# Patient Record
Sex: Female | Born: 1975
Health system: Southern US, Community
[De-identification: ages and names within clinical notes are randomized; demographics above are authoritative.]

## PROBLEM LIST (undated history)

## (undated) DIAGNOSIS — G43909 Migraine, unspecified, not intractable, without status migrainosus: Secondary | ICD-10-CM

## (undated) DIAGNOSIS — M549 Dorsalgia, unspecified: Secondary | ICD-10-CM

## (undated) DIAGNOSIS — F32A Depression, unspecified: Secondary | ICD-10-CM

## (undated) DIAGNOSIS — M419 Scoliosis, unspecified: Secondary | ICD-10-CM

## (undated) DIAGNOSIS — Q059 Spina bifida, unspecified: Secondary | ICD-10-CM

## (undated) DIAGNOSIS — F329 Major depressive disorder, single episode, unspecified: Secondary | ICD-10-CM

## (undated) HISTORY — PX: ABDOMINOPLASTY: SUR9

## (undated) HISTORY — DX: Major depressive disorder, single episode, unspecified: F32.9

## (undated) HISTORY — DX: Spina bifida, unspecified: Q05.9

## (undated) HISTORY — DX: Dorsalgia, unspecified: M54.9

## (undated) HISTORY — DX: Migraine, unspecified, not intractable, without status migrainosus: G43.909

## (undated) HISTORY — DX: Scoliosis, unspecified: M41.9

## (undated) HISTORY — DX: Depression, unspecified: F32.A

---

## 2012-02-24 ENCOUNTER — Ambulatory Visit (INDEPENDENT_AMBULATORY_CARE_PROVIDER_SITE_OTHER): Payer: BC Managed Care – PPO | Admitting: Emergency Medicine

## 2012-02-24 VITALS — BP 112/71 | HR 50 | Temp 98.1°F | Resp 18 | Ht 63.0 in | Wt 132.0 lb

## 2012-02-24 DIAGNOSIS — L0201 Cutaneous abscess of face: Secondary | ICD-10-CM

## 2012-02-24 DIAGNOSIS — L03211 Cellulitis of face: Secondary | ICD-10-CM

## 2012-02-24 MED ORDER — SULFAMETHOXAZOLE-TRIMETHOPRIM 800-160 MG PO TABS: 1.0000 | ORAL_TABLET | Freq: Two times a day (BID) | ORAL | Status: AC

## 2012-02-24 NOTE — Progress Notes (Signed)
  Subjective:    Patient ID: Jessica Stanton, female    DOB: Jun 09, 1976, 36 y.o.   MRN: [id number]  Rash This is a new problem. The problem has been gradually worsening since onset. The affected locations include the face. The rash is characterized by itchiness, redness and swelling. She was exposed to nothing. Pertinent negatives include no anorexia, congestion, cough, diarrhea, eye pain, facial edema, fatigue, fever, joint pain, nail changes, rhinorrhea, shortness of breath, sore throat or vomiting. Past treatments include nothing.      Review of Systems  Constitutional: Negative for fever and fatigue.  HENT: Negative for congestion, sore throat and rhinorrhea.   Eyes: Negative for pain.  Respiratory: Negative for cough and shortness of breath.   Cardiovascular: Negative.   Gastrointestinal: Negative.  Negative for vomiting, diarrhea and anorexia.  Musculoskeletal: Negative.  Negative for joint pain.  Skin: Positive for rash. Negative for nail changes.       Objective:   Physical Exam  Constitutional: She is oriented to person, place, and time. She appears well-developed and well-nourished.  HENT:  Head: Normocephalic and atraumatic.  Eyes: Conjunctivae are normal. Pupils are equal, round, and reactive to light. No scleral icterus.  Neck: Normal range of motion. Neck supple.  Cardiovascular: Normal rate.   Pulmonary/Chest: Effort normal.  Abdominal: Soft.  Musculoskeletal: Normal range of motion.  Neurological: She is alert and oriented to person, place, and time.  Skin: Skin is warm and dry. There is erythema (forehead).          Assessment & Plan:  Thinks she was bitten by a spider  Instructed to follow up if no improvement in one week or sooner for new or worsened symptoms.  Apply local moist heat qid

## 2012-03-27 ENCOUNTER — Other Ambulatory Visit: Payer: Self-pay | Admitting: Obstetrics & Gynecology

## 2012-03-27 DIAGNOSIS — R928 Other abnormal and inconclusive findings on diagnostic imaging of breast: Secondary | ICD-10-CM

## 2012-06-12 ENCOUNTER — Ambulatory Visit
Admission: RE | Admit: 2012-06-12 | Discharge: 2012-06-12 | Disposition: A | Discharge status: Home / Self Care | Payer: BC Managed Care – PPO | Source: Ambulatory Visit | Attending: Obstetrics & Gynecology | Admitting: Obstetrics & Gynecology

## 2012-06-12 DIAGNOSIS — R928 Other abnormal and inconclusive findings on diagnostic imaging of breast: Secondary | ICD-10-CM

## 2013-02-01 ENCOUNTER — Encounter: Payer: Self-pay | Admitting: Internal Medicine

## 2013-02-01 ENCOUNTER — Ambulatory Visit (INDEPENDENT_AMBULATORY_CARE_PROVIDER_SITE_OTHER): Payer: BC Managed Care – PPO | Admitting: Internal Medicine

## 2013-02-01 VITALS — BP 128/80 | HR 68 | Temp 97.9°F | Ht 62.0 in | Wt 131.0 lb

## 2013-02-01 DIAGNOSIS — W57XXXA Bitten or stung by nonvenomous insect and other nonvenomous arthropods, initial encounter: Secondary | ICD-10-CM

## 2013-02-01 DIAGNOSIS — T3695XA Adverse effect of unspecified systemic antibiotic, initial encounter: Secondary | ICD-10-CM

## 2013-02-01 DIAGNOSIS — B49 Unspecified mycosis: Secondary | ICD-10-CM

## 2013-02-01 DIAGNOSIS — T148 Other injury of unspecified body region: Secondary | ICD-10-CM

## 2013-02-01 MED ORDER — DOXYCYCLINE HYCLATE 100 MG PO TABS: 100.0000 mg | ORAL_TABLET | Freq: Two times a day (BID) | ORAL | Status: DC

## 2013-02-01 MED ORDER — FLUCONAZOLE 150 MG PO TABS: 150.0000 mg | ORAL_TABLET | Freq: Once | ORAL | Status: DC

## 2013-02-01 NOTE — Progress Notes (Signed)
  Subjective:    Patient ID: Jessica Stanton, female    DOB: October 07, 1975, 37 y.o.   MRN: [id number]  HPI  Pt presents to the clinic today with c/o a tick bite. This occurred on her left upper chest. The tick was on there for less than 12 hours. The placed where she pulled it off is still itching. It is not red. She has had some associated fatigue and nausea. She denies fever, chills or body aches. She has no rash. She has never had a tick borne illness before.  Review of Systems      No past medical history on file.  No current outpatient prescriptions on file.   No current facility-administered medications for this visit.    Allergies  Allergen Reactions  . Morphine And Related     No family history on file.  History   Social History  . Marital Status: Single    Spouse Name: N/A    Number of Children: N/A  . Years of Education: N/A   Occupational History  . Not on file.   Social History Main Topics  . Smoking status: Former Games developer  . Smokeless tobacco: Not on file  . Alcohol Use: Not on file  . Drug Use: Not on file  . Sexually Active: Not on file   Other Topics Concern  . Not on file   Social History Narrative  . No narrative on file     Constitutional: Pt reports fatigue. Denies fever, malaise, headache or abrupt weight changes.  Gastrointestinal: Pt reports nausea. Denies abdominal pain, bloating, constipation, diarrhea or blood in the stool.   Skin: Pt reports tick bite. Denies redness, rashes, lesions or ulcercations.  Neurological: Denies dizziness, difficulty with memory, difficulty with speech or problems with balance and coordination.   No other specific complaints in a complete review of systems (except as listed in HPI above).  Objective:   Physical Exam  BP 128/80  Pulse 68  Temp(Src) 97.9 F (36.6 C) (Oral)  Ht 5\' 2"  (1.575 m)  Wt 131 lb (59.421 kg)  BMI 23.95 kg/m2  SpO2 97%  LMP 01/15/2013 Wt Readings from Last 3 Encounters:   02/01/13 131 lb (59.421 kg)  02/24/12 132 lb (59.875 kg)    General: Appears her stated age, well developed, well nourished in NAD. Skin: Warm, dry and intact. Small healed tick bite of left upper chest, no redness or cellulitis noted. Cardiovascular: Normal rate and rhythm. S1,S2 noted.  No murmur, rubs or gallops noted. No JVD or BLE edema. No carotid bruits noted. Pulmonary/Chest: Normal effort and positive vesicular breath sounds. No respiratory distress. No wheezes, rales or ronchi noted.  Abdomen: Soft and nontender. Normal bowel sounds, no bruits noted. No distention or masses noted. Liver, spleen and kidneys non palpable.  Neurological: Alert and oriented. Cranial nerves II-XII intact. Coordination normal. +DTRs bilaterally.        Assessment & Plan:   Tick bite, new onset:  Given symptoms of fatigue and nausea will prophylactically treat with Doxcycline BID x 10 days  RTC as needed or if symptoms worse

## 2013-02-01 NOTE — Patient Instructions (Signed)
Deer Tick Bite Deer ticks are brown arachnids (spider family) that vary in size from as small as the head of a pin to 1/4 inch (1/2 cm) diameter. They thrive in wooded areas. Deer are the preferred host of adult deer ticks. Small rodents are the host of young ticks (nymphs). When a person walks in a field or wooded area, young and adult ticks in the surrounding grass and vegetation can attach themselves to the skin. They can suck blood for hours to days if unnoticed. Ticks are found all over the [place] Some ticks carry a specific bacteria (Borrelia burgdorferi) that causes an infection called Lyme disease. The bacteria is typically passed into a person during the blood sucking process. This happens after the tick has been attached for at least a number of hours. While ticks can be found all over the [place] those carrying the bacteria that causes Lyme disease are most common in [place] and the [place]. Only a small proportion of ticks in these areas carry the Lyme disease bacteria and cause human infections. Ticks usually attach to warm spots on the body, such as the:  Head.  Back.  Neck.  Armpits.  Groin. SYMPTOMS  Most of the time, a deer tick bite will not be felt. You may or may not see the attached tick. You may notice mild irritation or redness around the bite site. If the deer tick passes the Lyme disease bacteria to a person, a round, red rash may be noticed 2 to 3 days after the bite. The rash may be clear in the middle, like a bull's-eye or target. If not treated, other symptoms may develop several days to weeks after the onset of the rash. These symptoms may include:  New rash lesions.  Fatigue and weakness.  General ill feeling and achiness.  Chills.  Headache and neck pain.  Swollen lymph glands.  Sore muscles and joints. 5 to 15% of untreated people with Lyme disease may develop more severe illnesses after several weeks to months. This may include inflammation of the  brain lining (meningitis), nerve palsies, an abnormal heartbeat, or severe muscle and joint pain and inflammation (myositis or arthritis). DIAGNOSIS   Physical exam and medical history.  Viewing the tick if it was saved for confirmation.  Blood tests (to check or confirm the presence of Lyme disease). TREATMENT  Most ticks do not carry disease. If found, an attached tick should be removed using tweezers. Tweezers should be placed under the body of the tick so it is removed by its attachment parts (pincers). If there are signs or symptoms of being sick, or Lyme disease is confirmed, medicines (antibiotics) that kill germs are usually prescribed. In more severe cases, antibiotics may be given through an intravenous (IV) access. HOME CARE INSTRUCTIONS   Always remove ticks with tweezers. Do not use petroleum jelly or other methods to kill or remove the tick. Slide the tweezers under the body and pull out as much as you can. If you are not sure what it is, save it in a jar and show your caregiver.  Once you remove the tick, the skin will heal on its own. Wash your hands and the affected area with water and soap. You may place a bandage on the affected area.  Take medicine as directed. You may be advised to take a full course of antibiotics.  Follow up with your caregiver as recommended. FINDING OUT THE RESULTS OF YOUR TEST Not all test results are available   during your visit. If your test results are not back during the visit, make an appointment with your caregiver to find out the results. Do not assume everything is normal if you have not heard from your caregiver or the medical facility. It is important for you to follow up on all of your test results. PROGNOSIS  If Lyme disease is confirmed, early treatment with antibiotics is very effective. Following preventive guidelines is important since it is possible to get the disease more than once. PREVENTION   Wear long sleeves and long pants in  wooded or grassy areas. Tuck your pants into your socks.  Use an insect repellent while hiking.  Check yourself, your children, and your pets regularly for ticks after playing outside.  Clear piles of leaves or brush from your yard. Ticks might live there. SEEK MEDICAL CARE IF:   You or your child has an oral temperature above 102 F (38.9 C).  You develop a severe headache following the bite.  You feel generally ill.  You notice a rash.  You are having trouble removing the tick.  The bite area has red skin or yellow drainage. SEEK IMMEDIATE MEDICAL CARE IF:   Your face is weak and droopy or you have other neurological symptoms.  You have severe joint pain or weakness. MAKE SURE YOU:   Understand these instructions.  Will watch your condition.  Will get help right away if you are not doing well or get worse. FOR MORE INFORMATION Centers for Disease Control and Prevention: www.cdc.gov American Academy of [employer]icians: www.aafp.org Document Released: 11/24/2009 Document Revised: 11/22/2011 Document Reviewed: 11/24/2009 [employer] Patient Information 2014 [employer].  

## 2013-03-15 ENCOUNTER — Encounter: Payer: Self-pay | Admitting: *Deleted

## 2013-03-21 ENCOUNTER — Ambulatory Visit: Payer: BC Managed Care – PPO | Admitting: Internal Medicine

## 2013-03-21 DIAGNOSIS — Z0289 Encounter for other administrative examinations: Secondary | ICD-10-CM

## 2015-03-26 HISTORY — PX: BREAST ENHANCEMENT SURGERY: SHX7

## 2015-07-15 ENCOUNTER — Encounter: Payer: Self-pay | Admitting: Nurse Practitioner

## 2015-07-15 ENCOUNTER — Ambulatory Visit: Payer: Self-pay

## 2015-07-15 ENCOUNTER — Ambulatory Visit (INDEPENDENT_AMBULATORY_CARE_PROVIDER_SITE_OTHER): Payer: BLUE CROSS/BLUE SHIELD | Admitting: Nurse Practitioner

## 2015-07-15 ENCOUNTER — Encounter: Payer: Self-pay | Admitting: Podiatry

## 2015-07-15 VITALS — BP 118/74 | HR 80 | Temp 98.4°F | Resp 12 | Ht 63.0 in | Wt 141.0 lb

## 2015-07-15 DIAGNOSIS — R51 Headache: Secondary | ICD-10-CM

## 2015-07-15 DIAGNOSIS — R519 Headache, unspecified: Secondary | ICD-10-CM

## 2015-07-15 DIAGNOSIS — Z9109 Other allergy status, other than to drugs and biological substances: Secondary | ICD-10-CM

## 2015-07-15 DIAGNOSIS — M79673 Pain in unspecified foot: Secondary | ICD-10-CM

## 2015-07-15 DIAGNOSIS — R5382 Chronic fatigue, unspecified: Secondary | ICD-10-CM

## 2015-07-15 DIAGNOSIS — Z91048 Other nonmedicinal substance allergy status: Secondary | ICD-10-CM | POA: Diagnosis not present

## 2015-07-15 NOTE — Patient Instructions (Signed)
Will get head CT and neck xray Will get blood work  To use netti pot daily and as needed for sinuses/ allergies

## 2015-07-15 NOTE — Progress Notes (Signed)
Patient ID: Jessica Stanton, female   DOB: Apr 13, 1976, 39 y.o.   MRN: [id number]    PCP: Webb Silversmith, NP  Advanced Directive information    Allergies  Allergen Reactions  . Morphine And Related     Chief Complaint  Patient presents with  . New Patient (Initial Visit)    Establish care   . Medical Management of Chronic Issues    Migraines, and request for Body Scan      HPI: Patient is a 39 y.o. female seen in the office today to establish care. [facility]. Mostly does acupuncture, chiropractor etc. Hx of headaches for a long time but has gotten worse.  Specialist- GYN, physician for women, Juanda Chance NP, PAP 2016 Dermatologist- [facility]  Over the last 6 months has increase in fatigue and weakness, no motivation. Hands and feet cramp up, more in the last 6 months, numbess and tingling noted to bilateral upper and lower extremities. Has still been able to get up and get things done. Likes to dance.   Significant pain in breast cavity after breast augmentation. Thinking about getting them removed. Has been going for the past year since she has had it.   In the last 3 months she feel against a window and has a headache since. Starts on the side of her head and radiates through the head. Sometimes starts between her side and goes throughout her head.  Stress makes it worse, when she has the headache she needs to be in a dark room without moving. Pounding headaches. For 2 weeks went on every days, now once weekly.  Does take tylenol PM to help the pain which helps.  Over 10 years ago since has had imaging done of neck and back.   Pt reports TSH and hormones checked by GYN and were normal   Review of Systems: Review of Systems  Constitutional: Positive for activity change and fatigue. Negative for fever, appetite change and unexpected weight change.  HENT: Positive for rhinorrhea. Negative for  congestion and postnasal drip.   Cardiovascular: Positive for palpitations (at times, in the last 6 months 4 times).  Genitourinary: Negative for dysuria and difficulty urinating.  Musculoskeletal: Positive for back pain and arthralgias.       Back pain throughout back, from trauma, birth defect, etc  Allergic/Immunologic: Positive for environmental allergies.  Neurological: Positive for numbness (happened over the last 6 months, in feet and hands) and headaches.  Psychiatric/Behavioral: The patient is nervous/anxious.        Has depression, manageable but daily challenge     Past Medical History  Diagnosis Date  . Depression   . Scoliosis   . Spina bifida [facility])    Past Surgical History  Procedure Laterality Date  . Breast enhancement surgery  03/26/2015   Social History:   reports that she has never smoked. She has never used smokeless tobacco. She reports that she does not drink alcohol or use illicit drugs.  Family History  Problem Relation Age of Onset  . Diabetes Other     Other Blood Relative    Medications: Patient's Medications  New Prescriptions   No medications on file  Previous Medications   CYANOCOBALAMIN (B-12 PO)    Take by mouth daily.   MULTIPLE VITAMIN (MULTIVITAMIN) TABLET    Take 1 tablet by mouth daily.  Modified Medications   No medications on file  Discontinued Medications   DOXYCYCLINE (  VIBRA-TABS) 100 MG TABLET    Take 1 tablet (100 mg total) by mouth 2 (two) times daily.   FLUCONAZOLE (DIFLUCAN) 150 MG TABLET    Take 1 tablet (150 mg total) by mouth once.   NON FORMULARY    Molasses, Chloraphil     Physical Exam:  Filed Vitals:   07/15/15 0859  BP: 118/74  Pulse: 80  Temp: 98.4 F (36.9 C)  TempSrc: Oral  Resp: 12  Height: 5\' 3"  (1.6 m)  Weight: 141 lb (63.957 kg)  SpO2: 98%   Body mass index is 24.98 kg/(m^2).  Physical Exam  Constitutional: She is oriented to person, place, and time. She appears well-developed and  well-nourished. No distress.  HENT:  Head: Normocephalic and atraumatic.  Nose: Nose normal.  Mouth/Throat: Oropharynx is clear and moist. No oropharyngeal exudate.  Eyes: Conjunctivae are normal. Pupils are equal, round, and reactive to light.  Neck: Normal range of motion. Neck supple.  Cardiovascular: Normal rate, regular rhythm and normal heart sounds.   Pulmonary/Chest: Effort normal and breath sounds normal.  Abdominal: Soft. Bowel sounds are normal. She exhibits no distension. There is no tenderness.  Musculoskeletal: She exhibits no edema or tenderness.  Neurological: She is alert and oriented to person, place, and time. No cranial nerve deficit. Coordination normal.  Skin: Skin is warm and dry. She is not diaphoretic.  Psychiatric: She has a normal mood and affect.    Labs reviewed: Basic Metabolic Panel: No results for input(s): NA, K, CL, CO2, GLUCOSE, BUN, CREATININE, CALCIUM, MG, PHOS, TSH in the last 8760 hours. Liver Function Tests: No results for input(s): AST, ALT, ALKPHOS, BILITOT, PROT, ALBUMIN in the last 8760 hours. No results for input(s): LIPASE, AMYLASE in the last 8760 hours. No results for input(s): AMMONIA in the last 8760 hours. CBC: No results for input(s): WBC, NEUTROABS, HGB, HCT, MCV, PLT in the last 8760 hours. Lipid Panel: No results for input(s): CHOL, HDL, LDLCALC, TRIG, CHOLHDL, LDLDIRECT in the last 8760 hours. TSH: No results for input(s): TSH in the last 8760 hours. A1C: No results found for: HGBA1C   Assessment/Plan 1. Persistent headaches -possible post concussion after head being hit on a window or worsening migraines. TSH and hormone levels checked by GYN and per pt report were WNL. Pt would not like medication at this time if imagining is normal but would like to be referred to headache specialist. Will get CT head and cervical spine Xray at this time.  - CT Head Wo Contrast; Future - DG Cervical Spine Complete; Future  2. Chronic  fatigue -relates to increase stress, reports depression but feels as if she is managing properly.  - CBC with Differential - Comprehensive metabolic panel  3. Environmental allergies -may use nettipot daily and as needed    Jessica K. Harle Battiest  [facility] [phone] 8 am - 5 pm) [phone] (after hours)

## 2015-07-16 LAB — CBC WITH DIFFERENTIAL/PLATELET
BASOS ABS: 0 10*3/uL (ref 0.0–0.2)
BASOS: 0 %
EOS (ABSOLUTE): 0.3 10*3/uL (ref 0.0–0.4)
Eos: 4 %
Hematocrit: 39.3 % (ref 34.0–46.6)
Hemoglobin: 13 g/dL (ref 11.1–15.9)
IMMATURE GRANULOCYTES: 0 %
Immature Grans (Abs): 0 10*3/uL (ref 0.0–0.1)
LYMPHS: 29 %
Lymphocytes Absolute: 2.3 10*3/uL (ref 0.7–3.1)
MCH: 29.8 pg (ref 26.6–33.0)
MCHC: 33.1 g/dL (ref 31.5–35.7)
MCV: 90 fL (ref 79–97)
MONOS ABS: 0.5 10*3/uL (ref 0.1–0.9)
Monocytes: 6 %
NEUTROS PCT: 61 %
Neutrophils Absolute: 4.8 10*3/uL (ref 1.4–7.0)
PLATELETS: 258 10*3/uL (ref 150–379)
RBC: 4.36 x10E6/uL (ref 3.77–5.28)
RDW: 13.6 % (ref 12.3–15.4)
WBC: 7.9 10*3/uL (ref 3.4–10.8)

## 2015-07-16 LAB — COMPREHENSIVE METABOLIC PANEL
A/G RATIO: 1.9 (ref 1.1–2.5)
ALT: 12 IU/L (ref 0–32)
AST: 16 IU/L (ref 0–40)
Albumin: 4.4 g/dL (ref 3.5–5.5)
Alkaline Phosphatase: 46 IU/L (ref 39–117)
BILIRUBIN TOTAL: 0.3 mg/dL (ref 0.0–1.2)
BUN / CREAT RATIO: 12 (ref 8–20)
BUN: 8 mg/dL (ref 6–20)
CALCIUM: 9.2 mg/dL (ref 8.7–10.2)
CO2: 20 mmol/L (ref 18–29)
Chloride: 101 mmol/L (ref 97–106)
Creatinine, Ser: 0.65 mg/dL (ref 0.57–1.00)
GFR, EST AFRICAN AMERICAN: 130 mL/min/{1.73_m2} (ref >59)
GFR, EST NON AFRICAN AMERICAN: 113 mL/min/{1.73_m2} (ref >59)
GLUCOSE: 70 mg/dL (ref 65–99)
Globulin, Total: 2.3 g/dL (ref 1.5–4.5)
POTASSIUM: 4.3 mmol/L (ref 3.5–5.2)
Sodium: 141 mmol/L (ref 136–144)
Total Protein: 6.7 g/dL (ref 6.0–8.5)

## 2015-07-25 ENCOUNTER — Inpatient Hospital Stay: Admission: RE | Admit: 2015-07-25 | Payer: Self-pay | Source: Ambulatory Visit

## 2015-08-22 ENCOUNTER — Ambulatory Visit
Admission: RE | Admit: 2015-08-22 | Discharge: 2015-08-22 | Disposition: A | Discharge status: Home / Self Care | Payer: BLUE CROSS/BLUE SHIELD | Source: Ambulatory Visit | Attending: Nurse Practitioner | Admitting: Nurse Practitioner

## 2015-08-22 DIAGNOSIS — R51 Headache: Principal | ICD-10-CM

## 2015-08-22 DIAGNOSIS — R519 Headache, unspecified: Secondary | ICD-10-CM

## 2015-08-25 ENCOUNTER — Other Ambulatory Visit: Payer: Self-pay

## 2015-08-25 DIAGNOSIS — R51 Headache: Principal | ICD-10-CM

## 2015-08-25 DIAGNOSIS — R519 Headache, unspecified: Secondary | ICD-10-CM

## 2015-08-26 NOTE — Progress Notes (Signed)
This encounter was created in error - please disregard.

## 2015-09-05 ENCOUNTER — Telehealth: Payer: Self-pay | Admitting: Nurse Practitioner

## 2015-09-05 NOTE — Telephone Encounter (Signed)
FYI  Called patient to follow up on referral for Neurology, spoke to patient briefly and explained that Neurology has been leaving her message to schedule an appointment since 08/27/15 patient stated she already went for the CT I explained to the patient that Janett Billow referred her to Neurology based on the results of the CT patient then hung up on me

## 2015-09-24 ENCOUNTER — Ambulatory Visit: Payer: BC Managed Care – PPO | Admitting: Neurology

## 2015-09-30 ENCOUNTER — Ambulatory Visit: Payer: BLUE CROSS/BLUE SHIELD | Admitting: Neurology

## 2015-09-30 ENCOUNTER — Telehealth: Payer: Self-pay

## 2015-09-30 NOTE — Telephone Encounter (Signed)
Patient did not show to new patient appt.  

## 2015-10-14 ENCOUNTER — Ambulatory Visit (INDEPENDENT_AMBULATORY_CARE_PROVIDER_SITE_OTHER): Payer: BLUE CROSS/BLUE SHIELD | Admitting: Neurology

## 2015-10-14 ENCOUNTER — Encounter: Payer: Self-pay | Admitting: Neurology

## 2015-10-14 VITALS — Ht 63.0 in | Wt 139.0 lb

## 2015-10-14 DIAGNOSIS — G43109 Migraine with aura, not intractable, without status migrainosus: Secondary | ICD-10-CM | POA: Diagnosis not present

## 2015-10-14 MED ORDER — RIZATRIPTAN BENZOATE 5 MG PO TBDP: 5.0000 mg | ORAL_TABLET | ORAL | Status: DC | PRN

## 2015-10-14 NOTE — Patient Instructions (Addendum)
Your exam and CT head are normal.   Please remember, common headache triggers are: sleep deprivation, dehydration, overheating, stress, hypoglycemia or skipping meals and blood sugar fluctuations, excessive pain medications or excessive alcohol use or caffeine withdrawal. Some people have food triggers such as aged cheese, orange juice or chocolate, especially dark chocolate, or MSG (monosodium glutamate). Try to avoid these headache triggers as much possible. It may be helpful to keep a headache diary to figure out what makes your headaches worse or brings them on and what alleviates them. Some people report headache onset after exercise but studies have shown that regular exercise may actually prevent headaches from coming. If you have exercise-induced headaches, please make sure that you drink plenty of fluid before and after exercising and that you do not over do it and do not overheat.  For your migraine headache, let's try you on Maxalt orally disintegrating tab, 5 mg: take 1 pill early on when you suspect a migraine attack come on. You may take another pill within 2 hours, no more than 2 pills in 24 hours. Most people who take triptans do not have any serious side-effects. However, they can cause drowsiness (remember to not drive or use heavy machinery when drowsy), nausea, dizziness, dry mouth. Less common side effects include strange sensations, such as tightness in your chest or throat, tingling, flushing, and feelings of heaviness or pressure in areas such as the face, limbs, and chest. These in the chest can mimic heart related pain (angina) and may cause alarm, but usually these sensations are not harmful or a sign of a heart attack. However, if you develop intense chest pain or sensations of discomfort, you should stop taking your medication and consult with me or your PCP or go to the nearest urgent care facility or ER or call 911.   This medication is not safe for pregnancy, please stop taking  it, when you decide to get pregnant.
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
## 2015-10-31 ENCOUNTER — Other Ambulatory Visit: Payer: Self-pay | Admitting: Obstetrics and Gynecology

## 2015-10-31 DIAGNOSIS — S299XXA Unspecified injury of thorax, initial encounter: Secondary | ICD-10-CM

## 2015-10-31 DIAGNOSIS — N644 Mastodynia: Secondary | ICD-10-CM

## 2015-11-03 ENCOUNTER — Other Ambulatory Visit: Payer: BLUE CROSS/BLUE SHIELD

## 2015-11-05 ENCOUNTER — Other Ambulatory Visit: Payer: BLUE CROSS/BLUE SHIELD

## 2015-11-06 ENCOUNTER — Ambulatory Visit
Admission: RE | Admit: 2015-11-06 | Discharge: 2015-11-06 | Disposition: A | Discharge status: Home / Self Care | Payer: BLUE CROSS/BLUE SHIELD | Source: Ambulatory Visit | Attending: Obstetrics and Gynecology | Admitting: Obstetrics and Gynecology

## 2015-11-06 ENCOUNTER — Other Ambulatory Visit: Payer: BLUE CROSS/BLUE SHIELD

## 2015-11-06 DIAGNOSIS — N644 Mastodynia: Secondary | ICD-10-CM

## 2015-11-06 DIAGNOSIS — S299XXA Unspecified injury of thorax, initial encounter: Secondary | ICD-10-CM

## 2016-01-20 ENCOUNTER — Encounter: Payer: Self-pay | Admitting: Neurology

## 2016-02-12 ENCOUNTER — Ambulatory Visit: Payer: BLUE CROSS/BLUE SHIELD | Admitting: Neurology

## 2016-12-22 ENCOUNTER — Emergency Department (HOSPITAL_BASED_OUTPATIENT_CLINIC_OR_DEPARTMENT_OTHER)
Admission: EM | Admit: 2016-12-22 | Discharge: 2016-12-22 | Disposition: A | Discharge status: Home / Self Care | Payer: BLUE CROSS/BLUE SHIELD | Attending: Emergency Medicine | Admitting: Emergency Medicine

## 2016-12-22 ENCOUNTER — Emergency Department (HOSPITAL_BASED_OUTPATIENT_CLINIC_OR_DEPARTMENT_OTHER): Payer: BLUE CROSS/BLUE SHIELD

## 2016-12-22 ENCOUNTER — Encounter (HOSPITAL_BASED_OUTPATIENT_CLINIC_OR_DEPARTMENT_OTHER): Payer: Self-pay | Admitting: *Deleted

## 2016-12-22 DIAGNOSIS — R079 Chest pain, unspecified: Secondary | ICD-10-CM | POA: Insufficient documentation

## 2016-12-22 DIAGNOSIS — Z87891 Personal history of nicotine dependence: Secondary | ICD-10-CM | POA: Diagnosis not present

## 2016-12-22 DIAGNOSIS — N926 Irregular menstruation, unspecified: Secondary | ICD-10-CM | POA: Diagnosis not present

## 2016-12-22 DIAGNOSIS — M549 Dorsalgia, unspecified: Secondary | ICD-10-CM | POA: Insufficient documentation

## 2016-12-22 DIAGNOSIS — K5900 Constipation, unspecified: Secondary | ICD-10-CM | POA: Insufficient documentation

## 2016-12-22 DIAGNOSIS — R1012 Left upper quadrant pain: Secondary | ICD-10-CM | POA: Diagnosis present

## 2016-12-22 DIAGNOSIS — R112 Nausea with vomiting, unspecified: Secondary | ICD-10-CM | POA: Diagnosis not present

## 2016-12-22 DIAGNOSIS — R002 Palpitations: Secondary | ICD-10-CM | POA: Insufficient documentation

## 2016-12-22 DIAGNOSIS — R0602 Shortness of breath: Secondary | ICD-10-CM | POA: Diagnosis not present

## 2016-12-22 DIAGNOSIS — R350 Frequency of micturition: Secondary | ICD-10-CM | POA: Diagnosis not present

## 2016-12-22 DIAGNOSIS — R1013 Epigastric pain: Secondary | ICD-10-CM | POA: Insufficient documentation

## 2016-12-22 LAB — CBC
HEMATOCRIT: 40 % (ref 36.0–46.0)
Hemoglobin: 13.8 g/dL (ref 12.0–15.0)
MCH: 30.5 pg (ref 26.0–34.0)
MCHC: 34.5 g/dL (ref 30.0–36.0)
MCV: 88.3 fL (ref 78.0–100.0)
PLATELETS: 251 10*3/uL (ref 150–400)
RBC: 4.53 MIL/uL (ref 3.87–5.11)
RDW: 12.8 % (ref 11.5–15.5)
WBC: 9.8 10*3/uL (ref 4.0–10.5)

## 2016-12-22 LAB — COMPREHENSIVE METABOLIC PANEL
ALT: 18 U/L (ref 14–54)
AST: 25 U/L (ref 15–41)
Albumin: 4.3 g/dL (ref 3.5–5.0)
Alkaline Phosphatase: 52 U/L (ref 38–126)
Anion gap: 8 (ref 5–15)
BUN: 8 mg/dL (ref 6–20)
CHLORIDE: 106 mmol/L (ref 101–111)
CO2: 25 mmol/L (ref 22–32)
CREATININE: 0.76 mg/dL (ref 0.44–1.00)
Calcium: 9.3 mg/dL (ref 8.9–10.3)
GFR calc Af Amer: >60 mL/min (ref >60)
Glucose, Bld: 122 mg/dL — ABNORMAL HIGH (ref 65–99)
POTASSIUM: 3.8 mmol/L (ref 3.5–5.1)
Sodium: 139 mmol/L (ref 135–145)
Total Bilirubin: 0.4 mg/dL (ref 0.3–1.2)
Total Protein: 6.8 g/dL (ref 6.5–8.1)

## 2016-12-22 LAB — PREGNANCY, URINE: Preg Test, Ur: NEGATIVE

## 2016-12-22 LAB — URINALYSIS, MICROSCOPIC (REFLEX)
Bacteria, UA: NONE SEEN
WBC, UA: NONE SEEN WBC/hpf (ref 0–5)

## 2016-12-22 LAB — URINALYSIS, ROUTINE W REFLEX MICROSCOPIC
Bilirubin Urine: NEGATIVE
Glucose, UA: NEGATIVE mg/dL
Ketones, ur: NEGATIVE mg/dL
LEUKOCYTES UA: NEGATIVE
Nitrite: NEGATIVE
PH: 7.5 (ref 5.0–8.0)
Protein, ur: NEGATIVE mg/dL
Specific Gravity, Urine: 1.002 — ABNORMAL LOW (ref 1.005–1.030)

## 2016-12-22 LAB — OCCULT BLOOD X 1 CARD TO LAB, STOOL: Fecal Occult Bld: NEGATIVE

## 2016-12-22 LAB — LIPASE, BLOOD: LIPASE: 27 U/L (ref 11–51)

## 2016-12-22 MED ORDER — GI COCKTAIL ~~LOC~~
30.0000 mL | Freq: Once | ORAL | Status: AC
  Administered 2016-12-22: 30 mL via ORAL
  Filled 2016-12-22: qty 30

## 2016-12-22 MED ORDER — OMEPRAZOLE 20 MG PO CPDR: 20.0000 mg | DELAYED_RELEASE_CAPSULE | Freq: Every day | ORAL | 0 refills | Status: DC

## 2016-12-22 MED ORDER — ONDANSETRON 4 MG PO TBDP
4.0000 mg | ORAL_TABLET | Freq: Once | ORAL | Status: AC
  Administered 2016-12-22: 4 mg via ORAL
  Filled 2016-12-22: qty 1

## 2016-12-22 MED FILL — OMEPRAZOLE DR 20 MG CAPSULE: 20 | 14 days supply | Qty: 14 | Fill #0

## 2016-12-22 NOTE — ED Notes (Signed)
Patient transported to X-ray 

## 2016-12-22 NOTE — ED Triage Notes (Signed)
Pt c/o left lower abd pain with "dark stools" x 2 months

## 2016-12-22 NOTE — ED Notes (Signed)
ED Provider at bedside. 

## 2016-12-22 NOTE — Discharge Instructions (Signed)
As discussed, please take omeprazole once a day in the morning half-hour before breakfast. Follow-up with gastroenterology and schedule an appointment with primary care to establish care and follow-up.  Return to the emergency department if you experience chest pain, shortness of breath, nausea, vomiting, lightheadedness or any other new concerning symptoms in the meantime.

## 2016-12-22 NOTE — ED Provider Notes (Signed)
[facility] DEPT [facility] Provider Note   CSN: [id number] Arrival date & time: 12/22/16  1400     History   Chief Complaint Chief Complaint  Patient presents with  . Abdominal Pain    HPI Jessica Stanton is a 41 y.o. female presenting with 3 months of intermittent left upper quadrant pain radiating all over the abdomen and up into her substernal area as well as her back between her shoulder blades. She explains that 3 months ago she had coffee grind stool and emesis called 911 and they told her to be seen in the emergency department. She ended up not following up as she had dogs and couldn't leave her house. She reports that over the last few days the pain has become more frequent, more severe, and lasts longer. She was seen last Saturday and was scheduled for an ultrasound this coming Friday but could not wait. He explains that she has a history of ectopic pregnancies hence why they had scheduled her for a ultrasound. She explains that her last menstrual period was 2 months ago and she is normally regular every 28 days. Last one she only had a little bit of spotting for 2 days and should be having her period now but has not. She states that the pain is worse with food and better when she gets to sleep. She hasn't tried anything for the pain. 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 She takes spironolactone and multiple [place] herbs for acne. She also reports urinary frequency, nausea, vomiting. She also complains of palpitations and shortness of breath sitting still recently. Denies fever, dysuria, hematuria, blood in her stool, or other symptoms. She reports a normal bowel movement this morning.  HPI  Past Medical History:  Diagnosis Date  . Back pain   . Depression   . Migraine   . Scoliosis   . Spina bifida ([place])     There are no active problems to display for this patient.   Past Surgical History:  Procedure Laterality Date  . ABDOMINOPLASTY    . BREAST ENHANCEMENT SURGERY   03/26/2015    OB History    No data available       Home Medications    Prior to Admission medications   Medication Sig Start Date End Date Taking? Authorizing Provider  spironolactone (ALDACTONE) 50 MG tablet Take 50 mg by mouth daily.   Yes Historical Provider, MD  Multiple Vitamin (MULTIVITAMIN) tablet Take 1 tablet by mouth daily.    Historical Provider, MD  omeprazole (PRILOSEC) 20 MG capsule Take 1 capsule (20 mg total) by mouth daily. 12/22/16 01/05/17  Emeline General, PA-C    Family History Family History  Problem Relation Age of Onset  . Diabetes Other     Other Blood Relative    Social History Social History  Substance Use Topics  . Smoking status: Former Research scientist (life sciences)  . Smokeless tobacco: Never Used  . Alcohol use No     Allergies   Morphine and related and Penicillin g   Review of Systems Review of Systems  Constitutional: Negative for chills and fever.  HENT: Negative for ear pain and sore throat.   Eyes: Negative for pain and visual disturbance.  Respiratory: Positive for shortness of breath. Negative for cough, wheezing and stridor.   Cardiovascular: Positive for chest pain and palpitations. Negative for leg swelling.  Gastrointestinal: Positive for abdominal pain, constipation, nausea and vomiting. Negative for abdominal distention, blood in stool and diarrhea.  Genitourinary: Positive for  flank pain, frequency and menstrual problem. Negative for dysuria and hematuria.  Musculoskeletal: Positive for back pain. Negative for arthralgias, myalgias, neck pain and neck stiffness.  Skin: Negative for color change, pallor, rash and wound.  Neurological: Negative for dizziness, tremors, seizures, syncope, facial asymmetry, speech difficulty, weakness, light-headedness, numbness and headaches.     Physical Exam Updated Vital Signs BP 109/71 (BP Location: Right Arm)   Pulse 64   Temp 98.2 F (36.8 C) (Oral)   Resp 18   Ht 5\' 2"  (1.575 m)   Wt 62.6 kg    LMP 10/24/2016   SpO2 100%   BMI 25.24 kg/m   Physical Exam  Constitutional: She appears well-developed and well-nourished. No distress.  Afebrile, nontoxic-appearing, lying comfortably in bed in no acute distress  HENT:  Head: Normocephalic and atraumatic.  Eyes: Conjunctivae and EOM are normal. Right eye exhibits no discharge. Left eye exhibits no discharge. No scleral icterus.  Neck: Normal range of motion.  Cardiovascular: Normal rate, regular rhythm, normal heart sounds and intact distal pulses.   No murmur heard. Pulmonary/Chest: Effort normal and breath sounds normal. No respiratory distress. She has no wheezes. She has no rales. She exhibits no tenderness.  Abdominal: Soft. She exhibits no distension and no mass. There is tenderness. There is no rebound and no guarding.  Tender to palpation of the epigastric region  Musculoskeletal: Normal range of motion. She exhibits no edema or deformity.  Neurological: She is alert.  Skin: Skin is warm and dry. No rash noted. She is not diaphoretic. No erythema. No pallor.  Psychiatric: She has a normal mood and affect.  Nursing note and vitals reviewed.    ED Treatments / Results  Labs (all labs ordered are listed, but only abnormal results are displayed) Labs Reviewed  URINALYSIS, ROUTINE W REFLEX MICROSCOPIC - Abnormal; Notable for the following:       Result Value   Specific Gravity, Urine 1.002 (*)    Hgb urine dipstick TRACE (*)    All other components within normal limits  COMPREHENSIVE METABOLIC PANEL - Abnormal; Notable for the following:    Glucose, Bld 122 (*)    All other components within normal limits  URINALYSIS, MICROSCOPIC (REFLEX) - Abnormal; Notable for the following:    Squamous Epithelial / LPF 0-5 (*)    All other components within normal limits  PREGNANCY, URINE  LIPASE, BLOOD  CBC  OCCULT BLOOD X 1 CARD TO LAB, STOOL    EKG  EKG Interpretation  Date/Time:  Wednesday December 22 2016 14:28:04  EDT Ventricular Rate:  78 PR Interval:    QRS Duration: 72 QT Interval:  394 QTC Calculation: 449 R Axis:   43 Text Interpretation:  Sinus rhythm Baseline wander in lead(s) V4 V6 No previous ECGs available Confirmed by ZACKOWSKI  MD, SCOTT ([id number]) on 12/22/2016 2:46:00 PM       Radiology Dg Chest 2 View  Result Date: 12/22/2016 CLINICAL DATA:  Intermittent LEFT upper quadrant pain radiating to chest for 2 months. EXAM: CHEST  2 VIEW COMPARISON:  None. FINDINGS: Cardiomediastinal silhouette is normal. No pleural effusions or focal consolidations. Trachea projects midline and there is no pneumothorax. Soft tissue planes and included osseous structures are non-suspicious. Nipple piercings. IMPRESSION: Normal chest. Electronically Signed   By: Elon Alas M.D.   On: 12/22/2016 15:49    Procedures Procedures (including critical care time)  Medications Ordered in ED Medications  ondansetron (ZOFRAN-ODT) disintegrating tablet 4 mg (4 mg Oral Given 12/22/16  1532)  gi cocktail (Maalox,Lidocaine,Donnatal) (30 mLs Oral Given 12/22/16 1611)     Initial Impression / Assessment and Plan / ED Course  I have reviewed the triage vital signs and the nursing notes.  Pertinent labs & imaging results that were available during my care of the patient were reviewed by me and considered in my medical decision making (see chart for details).     Patient presents with left upper quadrant/epigastric pain radiating to her back entire abdomen and chest. Made worse with eating.   Labs are unremarkable, chest x-ray negative for pneumonia and EKG unremarkable. Patient reported improvement after GI cocktail. Hemoccult negative. I suspect that her pain could be due to peptic ulcer disease.   We'll discharge her home with a PPI and a close follow-up with gastroenterology for possible endoscopy and H.Pylori testing.  Patient improved while she was in the emergency department and was ready to go  home.  Discussed strict return precautions and advised to return to the emergency department if experiencing any new or worsening symptoms. Instructions were understood and patient agreed with discharge plan.  Final Clinical Impressions(s) / ED Diagnoses   Final diagnoses:  Left upper quadrant pain    New Prescriptions New Prescriptions   OMEPRAZOLE (PRILOSEC) 20 MG CAPSULE    Take 1 capsule (20 mg total) by mouth daily.     Emeline General, PA-C 12/22/16 [place], MD 12/23/16 1630

## 2018-03-13 ENCOUNTER — Encounter: Payer: Self-pay | Admitting: Infectious Diseases

## 2018-03-13 DIAGNOSIS — S30861A Insect bite (nonvenomous) of abdominal wall, initial encounter: Secondary | ICD-10-CM | POA: Insufficient documentation

## 2018-03-13 DIAGNOSIS — W57XXXA Bitten or stung by nonvenomous insect and other nonvenomous arthropods, initial encounter: Secondary | ICD-10-CM

## 2018-03-13 NOTE — Assessment & Plan Note (Deleted)
Presented 3 days following removal of tick to belly button. Lyme IgM western blot positive 3/3 bands; no Lyme EIA was obtained per CDC/IDSA testing algorithm. This makes interpreting likelihood of infection just with the western blot difficult.  Her RMSF IgM EIA was also positive - her symptoms however are not consistent with RMSF (no fever, petechial rash, headaches, systemic symptoms) and I would interpret this as a false positive result as well.

## 2018-03-13 NOTE — Progress Notes (Deleted)
Patient: Jessica Stanton  DOB: February 26, 1976 MRN: [id number] PCP: Lauree Chandler, NP  Referring Provider: Rosario Adie, MD [facility]   Patient Active Problem List   Diagnosis Date Noted  . Tick bite of abdomen 03/13/2018     Subjective:  No chief complaint on file.   Jessica Stanton is a 42 y.o. female here for evaluation regarding tick bite exposure. She presented to [facility] on 02/13/2018 after she pulled a tick from her belly button on 02/10/2018. The area was described to be pruritic, swollen and red. At the time of initial evaluation she denied any fevers, chills, sweats, headaches, joint pain or neck pain. Records indicate a wheal over the periumbilical region. She had lab work obtained at this visit including testing for Lyme IgM/IgG western blot, Ehrlichiosis, PCR, anaplasmosis PCR, RMSF IgM/IgG EIA. Her lyme IgM western blot was positive with 3 bands, RMSF IgM positive. She was given 14 days of Doxycycline PO.   Since she completed treatment for this she ***.   ROS  Past Medical History:  Diagnosis Date  . Back pain   . Depression   . Migraine   . Scoliosis   . Spina bifida [facility])     Outpatient Medications Prior to Visit  Medication Sig Dispense Refill  . Multiple Vitamin (MULTIVITAMIN) tablet Take 1 tablet by mouth daily.    Marland Kitchen omeprazole (PRILOSEC) 20 MG capsule Take 1 capsule (20 mg total) by mouth daily. 14 capsule 0  . spironolactone (ALDACTONE) 50 MG tablet Take 50 mg by mouth daily.     No facility-administered medications prior to visit.      Allergies  Allergen Reactions  . Morphine And Related Nausea And Vomiting  . Penicillin G Nausea And Vomiting    Social History   Tobacco Use  . Smoking status: Former Research scientist (life sciences)  . Smokeless tobacco: Never Used  Substance Use Topics  . Alcohol use: No    Alcohol/week: 0.0 oz  . Drug use: No    Family History  Problem Relation Age of Onset  . Diabetes Other        Other Blood Relative  .  Thyroid cancer Mother   . Heart disease Other     Objective:  There were no vitals filed for this visit. There is no height or weight on file to calculate BMI.  Physical Exam  Lab Results: Lab Results  Component Value Date   WBC 9.8 12/22/2016   HGB 13.8 12/22/2016   HCT 40.0 12/22/2016   MCV 88.3 12/22/2016   PLT 251 12/22/2016    Lab Results  Component Value Date   CREATININE 0.76 12/22/2016   BUN 8 12/22/2016   NA 139 12/22/2016   K 3.8 12/22/2016   CL 106 12/22/2016   CO2 25 12/22/2016    Lab Results  Component Value Date   ALT 18 12/22/2016   AST 25 12/22/2016   ALKPHOS 52 12/22/2016   BILITOT 0.4 12/22/2016     Assessment & Plan:   Problem List Items Addressed This Visit      Musculoskeletal and Integument   Tick bite of abdomen    Presented 3 days following removal of tick to belly button. Lyme IgM western blot positive 3/3 bands; no Lyme EIA was obtained per CDC/IDSA testing algorithm. This makes interpreting likelihood of infection just with the western blot difficult.  Her RMSF IgM EIA was also positive - her symptoms however are not consistent with RMSF (no  fever, petechial rash, headaches, systemic symptoms) and I would interpret this as a false positive result as well.          *** will return to clinic in *** {weeks/months} for follow up  [name], MSN, NP-C [facility] [place] Pager: [phone] Office: [phone]  03/13/18  9:56 PM

## 2018-03-14 ENCOUNTER — Ambulatory Visit: Payer: BLUE CROSS/BLUE SHIELD | Admitting: Infectious Diseases

## 2018-04-10 ENCOUNTER — Ambulatory Visit (INDEPENDENT_AMBULATORY_CARE_PROVIDER_SITE_OTHER): Payer: BLUE CROSS/BLUE SHIELD | Admitting: Internal Medicine

## 2018-04-10 ENCOUNTER — Encounter: Payer: Self-pay | Admitting: Internal Medicine

## 2018-04-10 VITALS — BP 108/70 | HR 69 | Temp 98.5°F | Ht 62.0 in | Wt 140.0 lb

## 2018-04-10 DIAGNOSIS — S30861S Insect bite (nonvenomous) of abdominal wall, sequela: Secondary | ICD-10-CM

## 2018-04-10 DIAGNOSIS — W57XXXS Bitten or stung by nonvenomous insect and other nonvenomous arthropods, sequela: Secondary | ICD-10-CM

## 2018-04-14 NOTE — Progress Notes (Signed)
  RFV: tickborne infection  Patient ID: Jessica Stanton, female   DOB: 1975/11/27, 42 y.o.   MRN: [id number]  HPI Dawnyel is a pleasant 42yo F with history of having a tick bite and erythema to her umbilicus roughly 3 weeks ago. She was given 2 courses of doxycycline (roughly 28 days) and still reports having memory difficulty, concentrations issues, headaches, and arthralgias. Her primary care doctors have repeated her tickborne serology -convalescent serology - appears that she may have had RMSF- Ig G is positive  She is referred here to see if there is anything further to do given her presentation.  Outpatient Encounter Medications as of 04/10/2018  Medication Sig  . Multiple Vitamin (MULTIVITAMIN) tablet Take 1 tablet by mouth daily.  Marland Kitchen spironolactone (ALDACTONE) 50 MG tablet Take 50 mg by mouth daily.  Marland Kitchen omeprazole (PRILOSEC) 20 MG capsule Take 1 capsule (20 mg total) by mouth daily.   No facility-administered encounter medications on file as of 04/10/2018.      Patient Active Problem List   Diagnosis Date Noted  . Tick bite of abdomen 03/13/2018     Health Maintenance Due  Topic Date Due  . HIV Screening  02/26/1992  . PAP SMEAR  01/12/2016  . TETANUS/TDAP  09/13/2016  . INFLUENZA VACCINE  04/13/2018    Social History   Tobacco Use  . Smoking status: Former Research scientist (life sciences)  . Smokeless tobacco: Never Used  Substance Use Topics  . Alcohol use: No    Alcohol/week: 0.0 oz  . Drug use: No  family history includes Diabetes in her other; Heart disease in her other; Thyroid cancer in her mother. Review of Systems Positive pertinents listed in hpi, otherwise 12 point ros is negative Physical Exam   BP 108/70   Pulse 69   Temp 98.5 F (36.9 C) (Oral)   Ht 5\' 2"  (1.575 m)   Wt 140 lb (63.5 kg)   LMP 03/14/2018   BMI 25.61 kg/m    Did not examine her CBC Lab Results  Component Value Date   WBC 9.8 12/22/2016   RBC 4.53 12/22/2016   HGB 13.8 12/22/2016   HCT 40.0  12/22/2016   PLT 251 12/22/2016   MCV 88.3 12/22/2016   MCH 30.5 12/22/2016   MCHC 34.5 12/22/2016   RDW 12.8 12/22/2016   LYMPHSABS 2.3 07/15/2015   EOSABS 0.3 07/15/2015    BMET Lab Results  Component Value Date   NA 139 12/22/2016   K 3.8 12/22/2016   CL 106 12/22/2016   CO2 25 12/22/2016   GLUCOSE 122 (H) 12/22/2016   BUN 8 12/22/2016   CREATININE 0.76 12/22/2016   CALCIUM 9.3 12/22/2016   GFRNONAA >60 12/22/2016   GFRAA >60 12/22/2016      Assessment and Plan Spent 30 min discussing tick bite avoidance. Which tickborne infections are more common in [place] and that she has finished the completion of treatment and does not need further abtx at this time.  Recommend better sleep hygiene and headache management - some features suggests that this maybe exacerbation of migraines  Also mention that there is likely something about tick bites that causes some immune phenomenon that makes people feel poorly after having the infection, despite being treated - that we do not completely understand at this time  Do not recommend going to "alternative treatments or lyme literate" practices

## 2018-09-14 ENCOUNTER — Other Ambulatory Visit: Payer: Self-pay

## 2018-09-14 ENCOUNTER — Encounter (HOSPITAL_COMMUNITY): Payer: Self-pay | Admitting: *Deleted

## 2018-09-14 ENCOUNTER — Emergency Department (HOSPITAL_COMMUNITY): Payer: Self-pay

## 2018-09-14 ENCOUNTER — Emergency Department (HOSPITAL_COMMUNITY)
Admission: EM | Admit: 2018-09-14 | Discharge: 2018-09-14 | Disposition: A | Discharge status: Home / Self Care | Payer: Self-pay | Attending: Emergency Medicine | Admitting: Emergency Medicine

## 2018-09-14 DIAGNOSIS — Z79899 Other long term (current) drug therapy: Secondary | ICD-10-CM | POA: Insufficient documentation

## 2018-09-14 DIAGNOSIS — M2550 Pain in unspecified joint: Secondary | ICD-10-CM | POA: Insufficient documentation

## 2018-09-14 DIAGNOSIS — R0789 Other chest pain: Secondary | ICD-10-CM | POA: Insufficient documentation

## 2018-09-14 DIAGNOSIS — Z87891 Personal history of nicotine dependence: Secondary | ICD-10-CM | POA: Insufficient documentation

## 2018-09-14 LAB — SEDIMENTATION RATE: Sed Rate: 4 mm/hr (ref 0–22)

## 2018-09-14 LAB — URINALYSIS, ROUTINE W REFLEX MICROSCOPIC
Bilirubin Urine: NEGATIVE
Glucose, UA: NEGATIVE mg/dL
Ketones, ur: 5 mg/dL — AB
Leukocytes, UA: NEGATIVE
Nitrite: NEGATIVE
PH: 7 (ref 5.0–8.0)
Protein, ur: NEGATIVE mg/dL
Specific Gravity, Urine: 1.004 — ABNORMAL LOW (ref 1.005–1.030)

## 2018-09-14 LAB — PREGNANCY, URINE: Preg Test, Ur: NEGATIVE

## 2018-09-14 LAB — COMPREHENSIVE METABOLIC PANEL
ALBUMIN: 4.2 g/dL (ref 3.5–5.0)
ALK PHOS: 39 U/L (ref 38–126)
ALT: 22 U/L (ref 0–44)
ANION GAP: 10 (ref 5–15)
AST: 20 U/L (ref 15–41)
BILIRUBIN TOTAL: 0.7 mg/dL (ref 0.3–1.2)
BUN: 6 mg/dL (ref 6–20)
CALCIUM: 9.2 mg/dL (ref 8.9–10.3)
CO2: 23 mmol/L (ref 22–32)
CREATININE: 0.73 mg/dL (ref 0.44–1.00)
Chloride: 106 mmol/L (ref 98–111)
GFR calc Af Amer: >60 mL/min (ref >60)
GFR calc non Af Amer: >60 mL/min (ref >60)
GLUCOSE: 84 mg/dL (ref 70–99)
Potassium: 3.9 mmol/L (ref 3.5–5.1)
Sodium: 139 mmol/L (ref 135–145)
Total Protein: 7.1 g/dL (ref 6.5–8.1)

## 2018-09-14 LAB — CBC WITH DIFFERENTIAL/PLATELET
Abs Immature Granulocytes: 0.03 10*3/uL (ref 0.00–0.07)
Basophils Absolute: 0 10*3/uL (ref 0.0–0.1)
Basophils Relative: 1 %
EOS ABS: 0.2 10*3/uL (ref 0.0–0.5)
EOS PCT: 2 %
HEMATOCRIT: 39.2 % (ref 36.0–46.0)
Hemoglobin: 12.9 g/dL (ref 12.0–15.0)
Immature Granulocytes: 0 %
LYMPHS ABS: 2 10*3/uL (ref 0.7–4.0)
Lymphocytes Relative: 26 %
MCH: 29.4 pg (ref 26.0–34.0)
MCHC: 32.9 g/dL (ref 30.0–36.0)
MCV: 89.3 fL (ref 80.0–100.0)
MONOS PCT: 7 %
Monocytes Absolute: 0.6 10*3/uL (ref 0.1–1.0)
Neutro Abs: 5 10*3/uL (ref 1.7–7.7)
Neutrophils Relative %: 64 %
Platelets: 240 10*3/uL (ref 150–400)
RBC: 4.39 MIL/uL (ref 3.87–5.11)
RDW: 12.9 % (ref 11.5–15.5)
WBC: 7.8 10*3/uL (ref 4.0–10.5)
nRBC: 0 % (ref 0.0–0.2)

## 2018-09-14 LAB — C-REACTIVE PROTEIN: CRP: <0.8 mg/dL (ref <1.0)

## 2018-09-14 LAB — TROPONIN I: Troponin I: <0.03 ng/mL (ref <0.03)

## 2018-09-14 MED ORDER — PREDNISONE 20 MG PO TABS: 40.0000 mg | ORAL_TABLET | Freq: Every day | ORAL | 0 refills | Status: DC

## 2018-09-14 NOTE — ED Triage Notes (Signed)
Pt states she has [place] spotted fever and that is the cause of her pain

## 2018-09-14 NOTE — ED Notes (Signed)
Discharge instructions and prescription discussed with Pt. Pt verbalized understanding. Pt stable and ambulatory.    

## 2018-09-14 NOTE — ED Provider Notes (Signed)
[place] EMERGENCY DEPARTMENT Provider Note   CSN: [id number] Arrival date & time: 09/14/18  1033     History   Chief Complaint Chief Complaint  Patient presents with  . Chest Pain  . Back Pain    HPI Jessica Stanton is a 43 y.o. female.  She is presenting to the emergency department complaining of over a years worth of chest pain arthralgias myalgias.  She said she tested positive for [employer] [employer] spotted fever last year and finished 28 days of antibiotics.  She was seen in ID clinic for her complaints and they felt that she had completed a full course of treatment and did not require any further antibiotics.  She has no insurance and does not have a primary care doctor.  She has a history of a solitary kidney and so she does not take anything for pain.  The history is provided by the patient.  Chest Pain   This is a chronic problem. Episode onset: 1 year. The problem occurs daily. The problem has not changed since onset.The pain is present in the substernal region, lateral region and epigastric region. The pain is moderate. The quality of the pain is described as heavy. Radiates to: everywhere. Associated symptoms include back pain, leg pain and malaise/fatigue. Pertinent negatives include no abdominal pain, no cough, no diaphoresis, no fever, no hemoptysis, no lower extremity edema, no shortness of breath and no syncope. She has tried nothing for the symptoms. The treatment provided no relief.  Back Pain   Associated symptoms include chest pain and leg pain. Pertinent negatives include no fever, no abdominal pain and no dysuria.    Past Medical History:  Diagnosis Date  . Back pain   . Depression   . Migraine   . Scoliosis   . Spina bifida [facility])     Patient Active Problem List   Diagnosis Date Noted  . Tick bite of abdomen 03/13/2018    Past Surgical History:  Procedure Laterality Date  . ABDOMINOPLASTY    . BREAST ENHANCEMENT SURGERY  03/26/2015      OB History   No obstetric history on file.      Home Medications    Prior to Admission medications   Medication Sig Start Date End Date Taking? Authorizing Provider  Multiple Vitamin (MULTIVITAMIN) tablet Take 1 tablet by mouth daily.    [provider]  omeprazole (PRILOSEC) 20 MG capsule Take 1 capsule (20 mg total) by mouth daily. 12/22/16 01/05/17  Emeline General, PA-C  spironolactone (ALDACTONE) 50 MG tablet Take 50 mg by mouth daily.    [provider]    Family History Family History  Problem Relation Age of Onset  . Diabetes Other        Other Blood Relative  . Thyroid cancer Mother   . Heart disease Other     Social History Social History   Tobacco Use  . Smoking status: Former Research scientist (life sciences)  . Smokeless tobacco: Never Used  Substance Use Topics  . Alcohol use: No    Alcohol/week: 0.0 standard drinks  . Drug use: No     Allergies   Morphine and related and Penicillin g   Review of Systems Review of Systems  Constitutional: Positive for malaise/fatigue. Negative for diaphoresis and fever.  HENT: Negative for sore throat.   Eyes: Negative for visual disturbance.  Respiratory: Negative for cough, hemoptysis and shortness of breath.   Cardiovascular: Positive for chest pain. Negative for syncope.  Gastrointestinal:  Negative for abdominal pain.  Genitourinary: Negative for dysuria.  Musculoskeletal: Positive for arthralgias, back pain and myalgias.  Skin: Negative for rash.  Neurological: Positive for speech difficulty.     Physical Exam Updated Vital Signs BP 112/81 (BP Location: Right Arm)   Pulse (!) 59   Temp 98.2 F (36.8 C) (Oral)   Resp 20   SpO2 99%   Physical Exam Vitals signs and nursing note reviewed.  Constitutional:      General: She is not in acute distress.    Appearance: She is well-developed.  HENT:     Head: Normocephalic and atraumatic.  Eyes:     Conjunctiva/sclera: Conjunctivae normal.  Neck:      Musculoskeletal: Neck supple.  Cardiovascular:     Rate and Rhythm: Normal rate and regular rhythm.     Heart sounds: No murmur.  Pulmonary:     Effort: Pulmonary effort is normal. No respiratory distress.     Breath sounds: Normal breath sounds.  Abdominal:     Palpations: Abdomen is soft.     Tenderness: There is no abdominal tenderness.  Musculoskeletal: Normal range of motion.     Right lower leg: No edema.     Left lower leg: No edema.  Skin:    General: Skin is warm and dry.  Neurological:     General: No focal deficit present.     Mental Status: She is alert.      ED Treatments / Results  Labs (all labs ordered are listed, but only abnormal results are displayed) Labs Reviewed  URINALYSIS, ROUTINE W REFLEX MICROSCOPIC - Abnormal; Notable for the following components:      Result Value   Color, Urine STRAW (*)    Specific Gravity, Urine 1.004 (*)    Hgb urine dipstick SMALL (*)    Ketones, ur 5 (*)    Bacteria, UA RARE (*)    All other components within normal limits  COMPREHENSIVE METABOLIC PANEL  TROPONIN I  CBC WITH DIFFERENTIAL/PLATELET  SEDIMENTATION RATE  C-REACTIVE PROTEIN  PREGNANCY, URINE    EKG EKG Interpretation  Date/Time:  Thursday September 14 2018 10:41:07 EST Ventricular Rate:  67 PR Interval:  136 QRS Duration: 68 QT Interval:  442 QTC Calculation: 467 R Axis:   56 Text Interpretation:  Normal sinus rhythm Septal infarct , age undetermined Abnormal ECG similar to prior 4/18 Confirmed by Aletta Edouard [phone]) on 09/14/2018 12:22:19 PM   Radiology Dg Chest 2 View  Result Date: 09/14/2018 CLINICAL DATA:  Chest and back pain for past year, former smoker. EXAM: CHEST - 2 VIEW COMPARISON:  PA and lateral chest x-ray of December 22, 2016 FINDINGS: The lungs are well-expanded and clear. The heart and pulmonary vascularity are normal. The mediastinum is normal in width. There is no pleural effusion. Bilateral breast implants are present. The bony  thorax exhibits no acute abnormality. IMPRESSION: There is no active cardiopulmonary disease. Electronically Signed   By: David  Martinique M.D.   On: 09/14/2018 13:55    Procedures Procedures (including critical care time)  Medications Ordered in ED Medications - No data to display   Initial Impression / Assessment and Plan / ED Course  I have reviewed the triage vital signs and the nursing notes.  Pertinent labs & imaging results that were available during my care of the patient were reviewed by me and considered in my medical decision making (see chart for details).  Clinical Course as of Sep 15 902  Thu Sep 14, 4965  6982 43 year old female with no primary care doctor here with over a years worth of chest pain myalgias arthralgias.  She attributes her symptoms to being positive for [facility] spotted fever last year.  Completed a course of 28 days of antibiotics.  Has seen ID clinic for this.   [MB]  3662 I reviewed the patient's findings of lab work chest x-ray EKG with her.  I explained that I do not have an obvious cause of her symptoms.  A let her know that she needs to get a primary care doctor so they can continue to help her with her problems.  Her renal function is fine so I think she could do over-the-counter NSAIDs.  I offered to do a short course of some steroids to see if that changed her symptoms and she is willing to try that.   [MB]    Clinical Course User Index [MB] Hayden Rasmussen, MD     Final Clinical Impressions(s) / ED Diagnoses   Final diagnoses:  Atypical chest pain  Arthralgia of multiple sites    ED Discharge Orders         Ordered    predniSONE (DELTASONE) 20 MG tablet  Daily     09/14/18 1434           Hayden Rasmussen, MD 09/15/18 [phone]

## 2018-09-14 NOTE — Discharge Instructions (Addendum)
You were seen in the emergency department for longstanding chest pain and pain in many of your joints.  You had a chest x-ray and EKG along with blood work that did not show an obvious cause of your symptoms.  Your kidney function was normal so I think it would be okay if he used over-the-counter NSAIDs.  We will try you on a short course of some steroids to see if that helps with the inflammation.  It will be important for you to establish care with a primary care doctor and we are giving you numbers for the women's clinic along with [facility].  Please return if any worsening symptoms.

## 2018-09-14 NOTE — ED Triage Notes (Signed)
Pt in c/o chest pain and back pain for the last year, states she has been seen for the same in the past and everything was negative, pt has not stopped since that time, was seen in March with [facility] and had colonoscopy and endoscopy completed as well related to this, has not followed up since that time but pain has continued, no distress noted

## 2018-10-03 ENCOUNTER — Other Ambulatory Visit: Payer: Self-pay

## 2018-10-03 ENCOUNTER — Encounter (HOSPITAL_COMMUNITY): Payer: Self-pay | Admitting: Emergency Medicine

## 2018-10-03 ENCOUNTER — Inpatient Hospital Stay (HOSPITAL_COMMUNITY)
Admission: AD | Admit: 2018-10-03 | Discharge: 2018-10-03 | Disposition: A | Discharge status: Home / Self Care | Payer: Self-pay | Attending: Obstetrics and Gynecology | Admitting: Obstetrics and Gynecology

## 2018-10-03 DIAGNOSIS — Z880 Allergy status to penicillin: Secondary | ICD-10-CM | POA: Insufficient documentation

## 2018-10-03 DIAGNOSIS — Z87891 Personal history of nicotine dependence: Secondary | ICD-10-CM | POA: Insufficient documentation

## 2018-10-03 DIAGNOSIS — G8929 Other chronic pain: Secondary | ICD-10-CM | POA: Insufficient documentation

## 2018-10-03 DIAGNOSIS — K625 Hemorrhage of anus and rectum: Secondary | ICD-10-CM | POA: Insufficient documentation

## 2018-10-03 DIAGNOSIS — R102 Pelvic and perineal pain: Secondary | ICD-10-CM | POA: Insufficient documentation

## 2018-10-03 LAB — CBC
HCT: 38 % (ref 36.0–46.0)
Hemoglobin: 12.5 g/dL (ref 12.0–15.0)
MCH: 29.8 pg (ref 26.0–34.0)
MCHC: 32.9 g/dL (ref 30.0–36.0)
MCV: 90.5 fL (ref 80.0–100.0)
Platelets: 227 10*3/uL (ref 150–400)
RBC: 4.2 MIL/uL (ref 3.87–5.11)
RDW: 13.2 % (ref 11.5–15.5)
WBC: 7.9 10*3/uL (ref 4.0–10.5)
nRBC: 0 % (ref 0.0–0.2)

## 2018-10-03 LAB — URINALYSIS, ROUTINE W REFLEX MICROSCOPIC
Bilirubin Urine: NEGATIVE
GLUCOSE, UA: NEGATIVE mg/dL
Hgb urine dipstick: NEGATIVE
Ketones, ur: NEGATIVE mg/dL
Leukocytes, UA: NEGATIVE
Nitrite: NEGATIVE
PH: 6 (ref 5.0–8.0)
Protein, ur: NEGATIVE mg/dL
Specific Gravity, Urine: 1.009 (ref 1.005–1.030)

## 2018-10-03 LAB — WET PREP, GENITAL
Clue Cells Wet Prep HPF POC: NONE SEEN
Sperm: NONE SEEN
Trich, Wet Prep: NONE SEEN
Yeast Wet Prep HPF POC: NONE SEEN

## 2018-10-03 LAB — POCT PREGNANCY, URINE: Preg Test, Ur: NEGATIVE

## 2018-10-03 NOTE — MAU Note (Signed)
Today, went to the restroom after having a bowel movement- there was a lot of blood in her stool, several small clots came out.  Has been having problems- never had rectal bleeding, a lot of testing done. Having a lot of pelvic pain, abd pain, pain on the actual pelvic bone.  Normal periods, no abn vag pain.  Feels something is being missed.

## 2018-10-03 NOTE — MAU Provider Note (Signed)
Chief Complaint: Abdominal Pain; Rectal Bleeding; and Pelvic Pain   First Provider Initiated Contact with Patient 10/03/18 1026     SUBJECTIVE HPI: Jessica Stanton is a 43 y.o. non pregnant female who presents to Maternity Admissions reporting with multiple complaints. Most recently is concerned about episode of rectal bleeding. This occurred today after a BM. Did not strain for BM. Bright red blood on toilet paper. No blood in stool or toilet. Denies rectal pain. States for the last year she has had alternating diarrhea & constipation.  Has had pelvic pain & vaginal pain for the last year. 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 Has not followed up with her PCP b/c she has lost her insurance since then.  Denies n/v, fever/chills, dysuria, vaginal discharge. Has not been sexually active in 8+ months. Reports normal monthly menses that are not heavy or painful.   Location: abdomen, pelvis Quality: cramping, sharp Severity: 8/10 on pain scale Duration: 1 year Timing: intermittent Modifying factors: nothing makes better or worse. Hasn't treated with pain medication Associated signs and symptoms: none  Past Medical History:  Diagnosis Date  . Back pain   . Depression   . Migraine   . Scoliosis   . Spina bifida ([place])    OB History  Gravida Para Term Preterm AB Living  4 1 1   3  0  SAB TAB Ectopic Multiple Live Births  3       1    # Outcome Date GA Lbr Len/2nd Weight Sex Delivery Anes PTL Lv  4 Term 04/25/94        DEC  3 SAB           2 SAB           1 SAB            Past Surgical History:  Procedure Laterality Date  . ABDOMINOPLASTY    . BREAST ENHANCEMENT SURGERY  03/26/2015   Social History   Socioeconomic History  . Marital status: Single    Spouse name: Not on file  . Number of children: 1  . Years of education: Assoc   . Highest education level: Not on file  Occupational History   . Occupation: Self employed  Social Needs  . Financial resource strain: Not on file  . Food insecurity:    Worry: Not on file    Inability: Not on file  . Transportation needs:    Medical: Not on file    Non-medical: Not on file  Tobacco Use  . Smoking status: Former Research scientist (life sciences)  . Smokeless tobacco: Never Used  Substance and Sexual Activity  . Alcohol use: No    Alcohol/week: 0.0 standard drinks  . Drug use: No  . Sexual activity: Not Currently    Birth control/protection: Abstinence  Lifestyle  . Physical activity:    Days per week: Not on file    Minutes per session: Not on file  . Stress: Not on file  Relationships  . Social connections:    Talks on phone: Not on file    Gets together: Not on file    Attends religious service: Not on file    Active member of club or organization: Not on file    Attends meetings of clubs or organizations: Not on file    Relationship status: Not on file  . Intimate partner violence:    Fear of  current or ex partner: Not on file    Emotionally abused: Not on file    Physically abused: Not on file    Forced sexual activity: Not on file  Other Topics Concern  . Not on file  Social History Narrative   Regular exercise-yes   Caffeine Use- denies          Diet: Vegetarian       Do you drink/ eat things with caffeine? Yes      Marital status:     N/A                          What year were you married ?      Do you live in a house, apartment,assistred living, condo, trailer, etc.)? House      Is it one or more stories? 2      How many persons live in your home ? 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      Do you exercise?   Yes                           Type & how often: 4 times a week      Do you have a living will? Yes      Do you have a DNR form?  No                     If not, do you want to discuss one? Yes      Do you have  signed POA?HPOA forms?    No             If so, please bring to your        appointment            Family History  Problem Relation Age of Onset  . Diabetes Other        Other Blood Relative  . Thyroid cancer Mother   . Heart disease Other    No current facility-administered medications on file prior to encounter.    Current Outpatient Medications on File Prior to Encounter  Medication Sig Dispense Refill  . Multiple Vitamin (MULTIVITAMIN) tablet Take 1 tablet by mouth daily.    Marland Kitchen spironolactone (ALDACTONE) 50 MG tablet Take 50 mg by mouth daily.    . predniSONE (DELTASONE) 20 MG tablet Take 2 tablets (40 mg total) by mouth daily. (Patient not taking: Reported on 10/03/2018) 10 tablet 0   Allergies  Allergen Reactions  . Morphine And Related Nausea And Vomiting  . Penicillin G Nausea And Vomiting    Did it involve swelling of the face/tongue/throat, SOB, or low BP? No Did it involve sudden or severe rash/hives, skin peeling, or any reaction on the inside of your mouth or nose? No Did you need to seek medical attention at a hospital or doctor's office? No When did it last happen? If all above answers are "NO", may proceed with cephalosporin use.  . Shellfish Allergy Hives    I have reviewed patient's Past Medical Hx, Surgical Hx, Family Hx, Social Hx, medications and allergies.   Review of Systems  Constitutional: Negative.   Gastrointestinal: Positive for abdominal pain, anal bleeding, constipation and diarrhea. Negative for abdominal  distention, blood in stool, nausea, rectal pain and vomiting.  Genitourinary: Positive for pelvic pain and vaginal pain. Negative for dysuria, genital sores, menstrual problem, vaginal bleeding and vaginal discharge.    OBJECTIVE Patient Vitals for the past 24 hrs:  BP Temp Temp src Pulse Resp SpO2 Weight  10/03/18 0957 (!) 107/59 98.6 F (37 C) Oral 65 17 97 % 66.1 kg   Constitutional: Well-developed, well-nourished female in no  acute distress.  Cardiovascular: normal rate & rhythm, no murmur Respiratory: normal rate and effort. Lung sounds clear throughout GI: Abd soft, non-tender, Pos BS x 4. No guarding or rebound tenderness MS: Extremities nontender, no edema, normal ROM Neurologic: Alert and oriented x 4.  GU:     SPECULUM EXAM: NEFG, physiologic discharge, no blood noted, cervix clean  BIMANUAL: No CMT. cervix closed; uterus normal size, no adnexal tenderness or masses.               RECTAL EXAM: no blood. No external hemorrhoids. No masses palpated by DRE  LAB RESULTS Results for orders placed or performed during the hospital encounter of 10/03/18 (from the past 24 hour(s))  Urinalysis, Routine w reflex microscopic     Status: None   Collection Time: 10/03/18 10:18 AM  Result Value Ref Range   Color, Urine YELLOW YELLOW   APPearance CLEAR CLEAR   Specific Gravity, Urine 1.009 1.005 - 1.030   pH 6.0 5.0 - 8.0   Glucose, UA NEGATIVE NEGATIVE mg/dL   Hgb urine dipstick NEGATIVE NEGATIVE   Bilirubin Urine NEGATIVE NEGATIVE   Ketones, ur NEGATIVE NEGATIVE mg/dL   Protein, ur NEGATIVE NEGATIVE mg/dL   Nitrite NEGATIVE NEGATIVE   Leukocytes, UA NEGATIVE NEGATIVE  Pregnancy, urine POC     Status: None   Collection Time: 10/03/18 10:22 AM  Result Value Ref Range   Preg Test, Ur NEGATIVE NEGATIVE  CBC     Status: None   Collection Time: 10/03/18 11:26 AM  Result Value Ref Range   WBC 7.9 4.0 - 10.5 K/uL   RBC 4.20 3.87 - 5.11 MIL/uL   Hemoglobin 12.5 12.0 - 15.0 g/dL   HCT 38.0 36.0 - 46.0 %   MCV 90.5 80.0 - 100.0 fL   MCH 29.8 26.0 - 34.0 pg   MCHC 32.9 30.0 - 36.0 g/dL   RDW 13.2 11.5 - 15.5 %   Platelets 227 150 - 400 K/uL   nRBC 0.0 0.0 - 0.2 %  Wet prep, genital     Status: Abnormal   Collection Time: 10/03/18 11:36 AM  Result Value Ref Range   Yeast Wet Prep HPF POC NONE SEEN NONE SEEN   Trich, Wet Prep NONE SEEN NONE SEEN   Clue Cells Wet Prep HPF POC NONE SEEN NONE SEEN   WBC, Wet Prep  HPF POC MANY (A) NONE SEEN   Sperm NONE SEEN     IMAGING No results found.  MAU COURSE Orders Placed This Encounter  Procedures  . Wet prep, genital  . Urinalysis, Routine w reflex microscopic  . CBC  . Pregnancy, urine POC  . Discharge patient   No orders of the defined types were placed in this encounter.   MDM UPT negative VSS, NAD Normal pelvic & rectal exam CBC -- no leukocytosis or anemia Wet prep neg except for many WBCs. No CMT or cervical discharge concerning for PID.   Discussed with patient the importance of f/u with PCP to manage & evaluate her chronic concerns. Will give patient  resources for offices that use sliding scale fee for self pay patients. No emergent condition evident today that would require admission or surgery. Patient agreeable with plan.   ASSESSMENT 1. Chronic pelvic pain in female   2. Bright red rectal bleeding     PLAN Discharge home in stable condition. Bleeding precautions F/u with Ed for worsening rectal bleeding Establish with PCP  Allergies as of 10/03/2018      Reactions   Morphine And Related Nausea And Vomiting   Penicillin G Nausea And Vomiting   Did it involve swelling of the face/tongue/throat, SOB, or low BP? No Did it involve sudden or severe rash/hives, skin peeling, or any reaction on the inside of your mouth or nose? No Did you need to seek medical attention at a hospital or doctor's office? No When did it last happen? If all above answers are "NO", may proceed with cephalosporin use.   Shellfish Allergy Hives      Medication List    STOP taking these medications   predniSONE 20 MG tablet Commonly known as:  DELTASONE     TAKE these medications   multivitamin tablet Take 1 tablet by mouth daily.   spironolactone 50 MG tablet Commonly known as:  ALDACTONE Take 50 mg by mouth daily.        Jorje Guild, NP 10/03/2018  12:04 PM

## 2018-10-03 NOTE — Discharge Instructions (Signed)
[place]     Insufficient Money for Medicine:           United Way: call "211"    MAP Program at [place] or [place] [phone]            No Primary Care Doctor:  To locate a primary care doctor that accepts your insurance or provides certain services:           [place]: [phone]           Physician Referral Service: [phone] ask for My [place]  If no insurance, you need to see if you qualify for [facility] orange card, call to set      up appointment for eligibility/enrollment at [phone] or [phone] or visit [place] ([address], [place]) to meet with a [facility] enrollment specialist.  Agencies that provide inexpensive (sliding fee scale) medical care:       [facility] Medicine at [place] - [phone]     [facility] [place] [facility] - [place] [phone]     [facility] - [place] [phone]  [facility] - [place] @ [place] [phone]  [facility] - [place] @ [place] - [phone]  [facility]: [phone]   Women's Clinic: [phone]   Planned Parenthood: [phone]   [facility] [place]    Foster Center Providers:           [facility] [phone] (No Family Planning accepted)          [address], [address], [phone], Mon-Fri 9am-5pm          [facility] [place] - [phone]  [place], [address] [place], Mon-Thursday 8am-5pm, Fri 8am-noon  [facility] - [place], [address], Mon-Fri 7:30am-4:30pm          [place] - [phone]          [address], [facility] - [phone] [address]          Only accepts [employer] patients after they have their name applied to their card  Self Pay (no insurance) in [facility]:           Sickle Cell Patients:   [place], [phone] [facility]:  [address], [place] [phone]       [facility]  [address], [place] [phone]  [place]:  [address] [phone]          [facility]           [place], [phone] [facility], [phone]  [facility] [place]           [place], [address], [place] [address]           [phone], Mon-Fri 9am-7pm, [place] 9am-1pm          [facility] Family Medicine @ [place], [place]          [facility] - [facility]           [address], [place] [facility] [place]  [address] [place] [phone]          [place], [place]  [facility] - [place]   [place], [place] [phone] [facility] [place]  [address] [phone]  Dr. Vista Lawman           [address], [place], [facility]           [address], [phone]          [facility]             [address] [phone]          [facility]           [place], [place], 1st & 3rd Saturday every month, 10am-1pm OTHERS:  [facility]  ([facility] Only)  [phone] (Thursday only) Strategies for finding a Primary Care Provider:  1) Find a Doctor and Pay Out of Pocket  Although you won't have to find out who is covered by your insurance plan, it is a good idea to ask around and get recommendations. You will then need to call the office and see if the doctor you have chosen will accept you as a new patient and what types of options they offer for patients who are self-pay. Some doctors offer discounts or will set up payment plans for their patients who do not have insurance, but you will need to ask so you aren't surprised when you get to your appointment.  2) [place] - To see if you qualify for orange card access to healthcare safety net providers.  Call for appointment for eligibility/enrollment at [phone] or [phone]. (Uninsured, 0-200% FPL, qualifying info)  Applicants for [facility] are first required to see if they are eligible to enroll in the [facility] Marketplace before enrolling in [facility] (and get an exemption if they are not).  GCCN Criteria for acceptance is:  ? Proof of ACA Marketing exemption - form or documentation  ? Valid photo ID (driver's license, state identification card, passport, home country ID)  ? Proof of [facility] residency (e.g. drivers license, lease/landlord information, pay stubs with address, utility bill, bank statement, etc.)  ? Proof of income (1040, last year's tax return, W2, 4 current pay stubs, other income proof)  ? Proof of assets (current bank statement + 3 most recent, disability paperwork, life insurance info, tax value on autos,  etc.)  3) [place] Department  Not all health departments have doctors that can see patients for sick visits, but many do, so it is worth a call to see if yours does. If you don't  know where your local health department is, you can check in your phone book. The CDC also has a tool to help you locate your state's health department, and many state websites also have listings of all of their local health departments.  4) Find a [facility]  If your illness is not likely to be very severe or complicated, you may want to try a walk in clinic. These are popping up all over the country in pharmacies, drugstores, and shopping centers. They're usually staffed by nurse practitioners or physician assistants that have been trained to treat common illnesses and complaints. They're usually fairly quick and inexpensive. However, if you have serious medical issues or chronic medical problems, these are probably not your best option        Rectal Bleeding  Rectal bleeding is when blood passes out of the anus. People with rectal bleeding may notice bright red blood in their underwear or in the toilet after having a bowel movement. They may also have dark red or black stools. Rectal bleeding is usually a sign that something is wrong. Many things can cause rectal bleeding, including:  Hemorrhoids. These are blood vessels in the anus or rectum that are larger than normal.  Fistulas. These are abnormal passages in the rectum and anus.  Anal fissures. This is a tear in the anus.  Diverticulosis. This is a condition in which pockets or sacs project from the bowel.  Proctitis and colitis. These are conditions in which the rectum, colon, or anus become inflamed.  Polyps. These are growths that can be cancerous (malignant) or non-cancerous (benign).  Part of the rectum sticking out from the anus (rectal prolapse).  Certain medicines.  Intestinal infections. Follow these instructions at home: Pay attention to any changes in your symptoms. Take these actions to help lessen bleeding and discomfort:  Eat a diet that is high in fiber. This will keep your stool soft, making it easier to  pass stools without straining. Ask your health care provider what foods and drinks are high in fiber.  Drink enough fluid to keep your urine clear or pale yellow. This also helps to keep your stool soft.  Try taking a warm bath. This may help soothe any pain in your rectum.  Keep all follow-up visits as told by your health care provider. This is important. Get help right away if:  You have new or increased rectal bleeding.  You have black or dark red stools.  You vomit blood or something that looks like coffee grounds.  You have pain or tenderness in your abdomen.  You have a fever.  You feel weak.  You feel nauseous.  You faint.  You have severe pain in your rectum.  You cannot have a bowel movement. This information is not intended to replace advice given to you by your health care provider. Make sure you discuss any questions you have with your health care provider. Document Released: 02/19/2002 Document Revised: 02/05/2016 Document Reviewed: 10/26/2015 Elsevier Interactive Patient Education  2019 [employer].

## 2018-10-05 LAB — GC/CHLAMYDIA PROBE AMP (~~LOC~~) NOT AT ARMC
Chlamydia: NEGATIVE
Neisseria Gonorrhea: NEGATIVE

## 2020-03-17 ENCOUNTER — Emergency Department (HOSPITAL_COMMUNITY)
Admission: EM | Admit: 2020-03-17 | Discharge: 2020-03-18 | Disposition: A | Discharge status: Home / Self Care | Payer: Self-pay | Attending: Emergency Medicine | Admitting: Emergency Medicine

## 2020-03-17 DIAGNOSIS — B349 Viral infection, unspecified: Secondary | ICD-10-CM | POA: Insufficient documentation

## 2020-03-17 DIAGNOSIS — R0789 Other chest pain: Secondary | ICD-10-CM

## 2020-03-17 DIAGNOSIS — Z79899 Other long term (current) drug therapy: Secondary | ICD-10-CM | POA: Insufficient documentation

## 2020-03-17 DIAGNOSIS — Z87891 Personal history of nicotine dependence: Secondary | ICD-10-CM | POA: Insufficient documentation

## 2020-03-17 DIAGNOSIS — Z20822 Contact with and (suspected) exposure to covid-19: Secondary | ICD-10-CM | POA: Insufficient documentation

## 2020-03-17 NOTE — ED Triage Notes (Signed)
Pt arrives via [facility] from home with c/o abdominal pain, bilateral rib pain, diarrhea, fevers, chills. No CP or coughing. Denies sick contacts. Temp 100.6, 1000 mg tylenol and 4 zofran, 350 NS given en route. Denies urinary symptoms. CBG 130. + wine and marijuana, hr 80, 122/60. Nausea and dry heaves.

## 2020-03-18 ENCOUNTER — Encounter (HOSPITAL_COMMUNITY): Payer: Self-pay | Admitting: *Deleted

## 2020-03-18 ENCOUNTER — Other Ambulatory Visit: Payer: Self-pay

## 2020-03-18 ENCOUNTER — Emergency Department (HOSPITAL_COMMUNITY): Payer: Self-pay

## 2020-03-18 LAB — COMPREHENSIVE METABOLIC PANEL
ALT: 15 U/L (ref 0–44)
AST: 20 U/L (ref 15–41)
Albumin: 3.5 g/dL (ref 3.5–5.0)
Alkaline Phosphatase: 58 U/L (ref 38–126)
Anion gap: 13 (ref 5–15)
BUN: 13 mg/dL (ref 6–20)
CO2: 19 mmol/L — ABNORMAL LOW (ref 22–32)
Calcium: 8.4 mg/dL — ABNORMAL LOW (ref 8.9–10.3)
Chloride: 106 mmol/L (ref 98–111)
Creatinine, Ser: 0.88 mg/dL (ref 0.44–1.00)
GFR calc Af Amer: >60 mL/min (ref >60)
GFR calc non Af Amer: >60 mL/min (ref >60)
Glucose, Bld: 141 mg/dL — ABNORMAL HIGH (ref 70–99)
Potassium: 3.2 mmol/L — ABNORMAL LOW (ref 3.5–5.1)
Sodium: 138 mmol/L (ref 135–145)
Total Bilirubin: 0.4 mg/dL (ref 0.3–1.2)
Total Protein: 6.1 g/dL — ABNORMAL LOW (ref 6.5–8.1)

## 2020-03-18 LAB — URINALYSIS, ROUTINE W REFLEX MICROSCOPIC
Bacteria, UA: NONE SEEN
Bilirubin Urine: NEGATIVE
Glucose, UA: NEGATIVE mg/dL
Ketones, ur: NEGATIVE mg/dL
Nitrite: NEGATIVE
Protein, ur: NEGATIVE mg/dL
Specific Gravity, Urine: 1.028 (ref 1.005–1.030)
pH: 5 (ref 5.0–8.0)

## 2020-03-18 LAB — CBC
HCT: 38.5 % (ref 36.0–46.0)
Hemoglobin: 12.6 g/dL (ref 12.0–15.0)
MCH: 29.7 pg (ref 26.0–34.0)
MCHC: 32.7 g/dL (ref 30.0–36.0)
MCV: 90.8 fL (ref 80.0–100.0)
Platelets: 217 10*3/uL (ref 150–400)
RBC: 4.24 MIL/uL (ref 3.87–5.11)
RDW: 13.2 % (ref 11.5–15.5)
WBC: 13.9 10*3/uL — ABNORMAL HIGH (ref 4.0–10.5)
nRBC: 0 % (ref 0.0–0.2)

## 2020-03-18 LAB — I-STAT BETA HCG BLOOD, ED (MC, WL, AP ONLY): I-stat hCG, quantitative: <5 m[IU]/mL (ref <5)

## 2020-03-18 LAB — LIPASE, BLOOD: Lipase: 147 U/L — ABNORMAL HIGH (ref 11–51)

## 2020-03-18 LAB — SARS CORONAVIRUS 2 BY RT PCR (HOSPITAL ORDER, PERFORMED IN ~~LOC~~ HOSPITAL LAB): SARS Coronavirus 2: NEGATIVE

## 2020-03-18 MED ORDER — SODIUM CHLORIDE 0.9% FLUSH
3.0000 mL | Freq: Once | INTRAVENOUS | Status: AC
  Administered 2020-03-18: 3 mL via INTRAVENOUS

## 2020-03-18 MED ORDER — KETOROLAC TROMETHAMINE 30 MG/ML IJ SOLN
30.0000 mg | Freq: Once | INTRAMUSCULAR | Status: AC
  Administered 2020-03-18: 30 mg via INTRAVENOUS
  Filled 2020-03-18: qty 1

## 2020-03-18 NOTE — Discharge Instructions (Addendum)
You were seen today for multiple systemic symptoms.  You had a low-grade temperature.  Your work-up is largely reassuring.  You may have a viral syndrome.  Given prevalence of COVID-19, testing was sent.  You need to quarantine until testing returns.  You may call for test results or check MyChart.  Make sure to stay hydrated.  Take naproxen as needed for pain or discomfort.  Tylenol for any fevers.

## 2020-03-18 NOTE — ED Provider Notes (Signed)
[place] EMERGENCY DEPARTMENT Provider Note   CSN: [id number] Arrival date & time: 03/17/20  2352     History Chief Complaint  Patient presents with  . Abdominal Pain    Jessica Stanton is a 44 y.o. female.  HPI      This a 44 year old female with a history of depression and spina bifida who presents with right-sided chest pain, abdominal pain, myalgias, fever, nausea and diarrhea.  Onset of symptoms 1 to 2 days ago.  Patient reports several day history of right-sided rib pain" difficulty lifting the arm secondary to pain.  Denies injury or heavy lifting.  Since that time she developed myalgias, upper abdominal discomfort and diarrhea.  Noted to have a temperature of 100.7 upon arrival.  No noted temperatures at home.  Denies urinary symptoms.  Currently rates her pain at 6 out of 10.  Chest pain is tender to palpation.  Not pleuritic in nature.  No shortness of breath or cough.  Patient has not taken anything for symptoms prior to EMS.  EMS gave patient 1000 g of Tylenol and 4 Zofran with some improvement.  Past Medical History:  Diagnosis Date  . Back pain   . Depression   . Migraine   . Scoliosis   . Spina bifida [facility])     Patient Active Problem List   Diagnosis Date Noted  . Tick bite of abdomen 03/13/2018    Past Surgical History:  Procedure Laterality Date  . ABDOMINOPLASTY    . BREAST ENHANCEMENT SURGERY  03/26/2015     OB History    Gravida  4   Para  1   Term  1   Preterm      AB  3   Living  0     SAB  3   TAB      Ectopic      Multiple      Live Births  1           Family History  Problem Relation Age of Onset  . Diabetes Other        Other Blood Relative  . Thyroid cancer Mother   . Heart disease Other     Social History   Tobacco Use  . Smoking status: Former Research scientist (life sciences)  . Smokeless tobacco: Never Used  Substance Use Topics  . Alcohol use: No    Alcohol/week: 0.0 standard drinks  . Drug use: No     Home Medications Prior to Admission medications   Medication Sig Start Date End Date Taking? Authorizing Provider  Multiple Vitamin (MULTIVITAMIN) tablet Take 1 tablet by mouth daily.    [provider]  spironolactone (ALDACTONE) 50 MG tablet Take 50 mg by mouth daily.    [provider]    Allergies    Morphine and related, Penicillin g, and Shellfish allergy  Review of Systems   Review of Systems  Constitutional: Positive for chills and fever.  Respiratory: Negative for shortness of breath.   Cardiovascular: Positive for chest pain.  Gastrointestinal: Positive for abdominal pain, diarrhea and nausea. Negative for vomiting.  Genitourinary: Negative for dysuria.  All other systems reviewed and are negative.   Physical Exam Updated Vital Signs BP (!) 101/55 (BP Location: Right Arm)   Pulse 63   Temp 98.8 F (37.1 C) (Oral)   Resp 17   SpO2 100%   Physical Exam Vitals and nursing note reviewed.  Constitutional:      Appearance: She is well-developed.  She is not ill-appearing.  HENT:     Head: Normocephalic and atraumatic.  Eyes:     Pupils: Pupils are equal, round, and reactive to light.  Cardiovascular:     Rate and Rhythm: Normal rate and regular rhythm.     Heart sounds: Normal heart sounds.  Pulmonary:     Effort: Pulmonary effort is normal. No respiratory distress.     Breath sounds: No wheezing.     Comments: Right-sided chest wall tenderness to palpation, no overlying skin changes or crepitus Chest:     Chest wall: Tenderness present.  Abdominal:     General: Bowel sounds are normal.     Palpations: Abdomen is soft.     Tenderness: There is no abdominal tenderness. There is no guarding or rebound.  Musculoskeletal:     Cervical back: Neck supple.  Skin:    General: Skin is warm and dry.  Neurological:     Mental Status: She is alert and oriented to person, place, and time.  Psychiatric:        Mood and Affect: Mood normal.      ED Results / Procedures / Treatments   Labs (all labs ordered are listed, but only abnormal results are displayed) Labs Reviewed  LIPASE, BLOOD - Abnormal; Notable for the following components:      Result Value   Lipase 147 (*)    All other components within normal limits  COMPREHENSIVE METABOLIC PANEL - Abnormal; Notable for the following components:   Potassium 3.2 (*)    CO2 19 (*)    Glucose, Bld 141 (*)    Calcium 8.4 (*)    Total Protein 6.1 (*)    All other components within normal limits  CBC - Abnormal; Notable for the following components:   WBC 13.9 (*)    All other components within normal limits  URINALYSIS, ROUTINE W REFLEX MICROSCOPIC - Abnormal; Notable for the following components:   APPearance HAZY (*)    Hgb urine dipstick SMALL (*)    Leukocytes,Ua SMALL (*)    All other components within normal limits  SARS CORONAVIRUS 2 BY RT PCR (HOSPITAL ORDER, [place] [facility])  I-STAT BETA HCG BLOOD, ED ([facility], [facility], [facility] ONLY)    EKG None  Radiology DG Chest 2 View  Result Date: 03/18/2020 CLINICAL DATA:  Right-sided chest pain EXAM: CHEST - 2 VIEW COMPARISON:  09/14/2018 FINDINGS: Normal heart size and mediastinal contours. No acute infiltrate or edema. No effusion or pneumothorax. No acute osseous findings. IMPRESSION: Negative chest. Electronically Signed   By: Monte Fantasia M.D.   On: 03/18/2020 05:36    Procedures Procedures (including critical care time)  Medications Ordered in ED Medications  sodium chloride flush (NS) 0.9 % injection 3 mL (3 mLs Intravenous Given 03/18/20 0406)  ketorolac (TORADOL) 30 MG/ML injection 30 mg (30 mg Intravenous Given 03/18/20 0451)    ED Course  I have reviewed the triage vital signs and the nursing notes.  Pertinent labs & imaging results that were available during my care of the patient were reviewed by me and considered in my medical decision making (see chart for details).    MDM  Rules/Calculators/A&P                           Patient presents with multiple complaints including chest pain, abdominal pain, nausea, low-grade fever.  She is nontoxic-appearing on exam.  Temperature of 100.7.  She has some reproducible right-sided chest wall pain.  No crepitus or overlying skin changes.  Abdominal exam is benign.  Given constellation of symptoms, question viral syndrome.  Less likely ACS, PE, intra-abdominal pathology given benign exam.  Lab work obtained and reviewed from triage.  Urinalysis is clear.  Metabolic panel with mild hypokalemia at 3.2.  She has a leukocytosis to 13.9.  Chest x-ray shows no evidence of pneumothorax or pneumonia.   No obvious infectious source at this time.  She is not vaccinated against COVID-19.  Given ongoing pandemic, testing was sent.  Patient improved after Toradol.  Recommend supportive measures at home.  Highly suspicious for viral syndrome.  After history, exam, and medical workup I feel the patient has been appropriately medically screened and is safe for discharge home. Pertinent diagnoses were discussed with the patient. Patient was given return precautions.  Jessica Stanton was evaluated in Emergency Department on 03/18/2020 for the symptoms described in the history of present illness. She was evaluated in the context of the global COVID-19 pandemic, which necessitated consideration that the patient might be at risk for infection with the SARS-CoV-2 virus that causes COVID-19. Institutional protocols and algorithms that pertain to the evaluation of patients at risk for COVID-19 are in a state of rapid change based on information released by regulatory bodies including the CDC and federal and state organizations. These policies and algorithms were followed during the patient's care in the ED.  Final Clinical Impression(s) / ED Diagnoses Final diagnoses:  Atypical chest pain  Viral syndrome    Rx / DC Orders ED Discharge Orders    None        Merryl Hacker, MD 03/18/20 2310

## 2020-03-18 NOTE — ED Notes (Signed)
Claiborne Billings, Triage RN, aware of blood pressure

## 2020-05-13 ENCOUNTER — Emergency Department (HOSPITAL_COMMUNITY)
Admission: EM | Admit: 2020-05-13 | Discharge: 2020-05-14 | Disposition: A | Discharge status: Home / Self Care | Payer: HRSA Program | Attending: Emergency Medicine | Admitting: Emergency Medicine

## 2020-05-13 ENCOUNTER — Emergency Department (HOSPITAL_COMMUNITY): Payer: HRSA Program

## 2020-05-13 DIAGNOSIS — H9209 Otalgia, unspecified ear: Secondary | ICD-10-CM | POA: Diagnosis not present

## 2020-05-13 DIAGNOSIS — U071 COVID-19: Secondary | ICD-10-CM | POA: Insufficient documentation

## 2020-05-13 DIAGNOSIS — R0602 Shortness of breath: Secondary | ICD-10-CM

## 2020-05-13 DIAGNOSIS — R079 Chest pain, unspecified: Secondary | ICD-10-CM

## 2020-05-13 DIAGNOSIS — Z87891 Personal history of nicotine dependence: Secondary | ICD-10-CM | POA: Insufficient documentation

## 2020-05-13 DIAGNOSIS — R509 Fever, unspecified: Secondary | ICD-10-CM | POA: Diagnosis present

## 2020-05-13 DIAGNOSIS — R112 Nausea with vomiting, unspecified: Secondary | ICD-10-CM | POA: Diagnosis not present

## 2020-05-13 DIAGNOSIS — R1084 Generalized abdominal pain: Secondary | ICD-10-CM | POA: Diagnosis not present

## 2020-05-13 LAB — CBC
HCT: 44.2 % (ref 36.0–46.0)
Hemoglobin: 14.3 g/dL (ref 12.0–15.0)
MCH: 28.7 pg (ref 26.0–34.0)
MCHC: 32.4 g/dL (ref 30.0–36.0)
MCV: 88.8 fL (ref 80.0–100.0)
Platelets: 203 10*3/uL (ref 150–400)
RBC: 4.98 MIL/uL (ref 3.87–5.11)
RDW: 13.3 % (ref 11.5–15.5)
WBC: 4.2 10*3/uL (ref 4.0–10.5)
nRBC: 0 % (ref 0.0–0.2)

## 2020-05-13 LAB — BASIC METABOLIC PANEL
Anion gap: 10 (ref 5–15)
BUN: 8 mg/dL (ref 6–20)
CO2: 24 mmol/L (ref 22–32)
Calcium: 9.3 mg/dL (ref 8.9–10.3)
Chloride: 103 mmol/L (ref 98–111)
Creatinine, Ser: 0.98 mg/dL (ref 0.44–1.00)
GFR calc Af Amer: >60 mL/min (ref >60)
GFR calc non Af Amer: >60 mL/min (ref >60)
Glucose, Bld: 145 mg/dL — ABNORMAL HIGH (ref 70–99)
Potassium: 3.6 mmol/L (ref 3.5–5.1)
Sodium: 137 mmol/L (ref 135–145)

## 2020-05-13 LAB — I-STAT BETA HCG BLOOD, ED (MC, WL, AP ONLY): I-stat hCG, quantitative: <5 m[IU]/mL (ref <5)

## 2020-05-13 LAB — SARS CORONAVIRUS 2 BY RT PCR (HOSPITAL ORDER, PERFORMED IN ~~LOC~~ HOSPITAL LAB): SARS Coronavirus 2: POSITIVE — AB

## 2020-05-13 NOTE — ED Triage Notes (Signed)
BIB EMS from home. Patient presents with COVID like S/S. Fever/chills, cough, sore throat, body aches. Patient given 1g Tylenol PTA.

## 2020-05-13 NOTE — ED Notes (Signed)
Called for triage and unable to locate in lobby  

## 2020-05-13 NOTE — ED Notes (Signed)
NOT vaccinated against COVID

## 2020-05-14 ENCOUNTER — Telehealth: Payer: Self-pay | Admitting: Nurse Practitioner

## 2020-05-14 ENCOUNTER — Other Ambulatory Visit: Payer: Self-pay

## 2020-05-14 LAB — HEPATIC FUNCTION PANEL
ALT: 17 U/L (ref 0–44)
AST: 19 U/L (ref 15–41)
Albumin: 3.7 g/dL (ref 3.5–5.0)
Alkaline Phosphatase: 46 U/L (ref 38–126)
Bilirubin, Direct: <0.1 mg/dL (ref 0.0–0.2)
Total Bilirubin: 0.6 mg/dL (ref 0.3–1.2)
Total Protein: 6.8 g/dL (ref 6.5–8.1)

## 2020-05-14 LAB — LIPASE, BLOOD: Lipase: 28 U/L (ref 11–51)

## 2020-05-14 LAB — TROPONIN I (HIGH SENSITIVITY)
Troponin I (High Sensitivity): 9 ng/L (ref <18)
Troponin I (High Sensitivity): 8 ng/L (ref <18)

## 2020-05-14 MED ORDER — ONDANSETRON 4 MG PO TBDP: 4.0000 mg | ORAL_TABLET | Freq: Three times a day (TID) | ORAL | 0 refills | Status: DC | PRN

## 2020-05-14 MED ORDER — DICYCLOMINE HCL 10 MG PO CAPS
10.0000 mg | ORAL_CAPSULE | Freq: Once | ORAL | Status: AC
  Administered 2020-05-14: 10 mg via ORAL
  Filled 2020-05-14: qty 1

## 2020-05-14 MED ORDER — ONDANSETRON HCL 4 MG/2ML IJ SOLN
4.0000 mg | Freq: Once | INTRAMUSCULAR | Status: AC
  Administered 2020-05-14: 4 mg via INTRAVENOUS
  Filled 2020-05-14: qty 2

## 2020-05-14 MED ORDER — FLUTICASONE PROPIONATE 50 MCG/ACT NA SUSP: 1.0000 | Freq: Every day | NASAL | 0 refills | Status: DC | PRN

## 2020-05-14 MED ORDER — BENZONATATE 100 MG PO CAPS: 100.0000 mg | ORAL_CAPSULE | Freq: Three times a day (TID) | ORAL | 0 refills | Status: DC

## 2020-05-14 MED ORDER — SODIUM CHLORIDE 0.9 % IV BOLUS
500.0000 mL | Freq: Once | INTRAVENOUS | Status: AC
  Administered 2020-05-14: 500 mL via INTRAVENOUS

## 2020-05-14 MED ORDER — DICYCLOMINE HCL 20 MG PO TABS: 20.0000 mg | ORAL_TABLET | Freq: Three times a day (TID) | ORAL | 0 refills | Status: DC | PRN

## 2020-05-14 MED ORDER — ALBUTEROL SULFATE HFA 108 (90 BASE) MCG/ACT IN AERS
2.0000 | INHALATION_SPRAY | Freq: Once | RESPIRATORY_TRACT | Status: AC
  Administered 2020-05-14: 2 via RESPIRATORY_TRACT
  Filled 2020-05-14: qty 6.7

## 2020-05-14 MED ORDER — NAPROXEN 500 MG PO TABS: 500.0000 mg | ORAL_TABLET | Freq: Two times a day (BID) | ORAL | 0 refills | Status: DC | PRN

## 2020-05-14 MED ORDER — KETOROLAC TROMETHAMINE 15 MG/ML IJ SOLN
15.0000 mg | Freq: Once | INTRAMUSCULAR | Status: AC
  Administered 2020-05-14: 15 mg via INTRAVENOUS
  Filled 2020-05-14: qty 1

## 2020-05-14 NOTE — ED Provider Notes (Signed)
[place] EMERGENCY DEPARTMENT Provider Note   CSN: [id number] Arrival date & time: 05/13/20  1704     History Chief Complaint  Patient presents with  . Fever    Jessica Stanton is a 44 y.o. female with a history of migraines & spina bifida who presents to the ED with complaints of fever x 5 days. Patient reports she has felt poorly with fever, chills, nasal congestion, ear pain, productive cough with phlegm sputum, dyspnea, chest pain (constant, aching- no change w/ deep breath/exertion), nausea, vomiting, diarrhea, intermittent abdominal pain, and generalized body aches. Her fatigue is worse with activity, no other alleviating/aggravating factors. Denies leg pain/swelling, hemoptysis, recent surgery/trauma, recent long travel, hormone use, personal hx of cancer, or hx of DVT/PE.  She has not been vaccinated against COVID-19.   HPI     Past Medical History:  Diagnosis Date  . Back pain   . Depression   . Migraine   . Scoliosis   . Spina bifida [facility])     Patient Active Problem List   Diagnosis Date Noted  . Tick bite of abdomen 03/13/2018    Past Surgical History:  Procedure Laterality Date  . ABDOMINOPLASTY    . BREAST ENHANCEMENT SURGERY  03/26/2015     OB History    Gravida  4   Para  1   Term  1   Preterm      AB  3   Living  0     SAB  3   TAB      Ectopic      Multiple      Live Births  1           Family History  Problem Relation Age of Onset  . Diabetes Other        Other Blood Relative  . Thyroid cancer Mother   . Heart disease Other     Social History   Tobacco Use  . Smoking status: Former Research scientist (life sciences)  . Smokeless tobacco: Never Used  Substance Use Topics  . Alcohol use: No    Alcohol/week: 0.0 standard drinks  . Drug use: No    Home Medications Prior to Admission medications   Medication Sig Start Date End Date Taking? Authorizing Provider  Multiple Vitamin (MULTIVITAMIN) tablet Take 1 tablet by  mouth daily.    [provider]  spironolactone (ALDACTONE) 50 MG tablet Take 50 mg by mouth daily.    [provider]    Allergies    Morphine and related, Penicillin g, and Shellfish allergy  Review of Systems   Review of Systems  Constitutional: Positive for chills, fatigue and fever.  HENT: Positive for congestion and ear pain.   Respiratory: Positive for cough and shortness of breath.   Cardiovascular: Positive for chest pain. Negative for leg swelling.  Gastrointestinal: Positive for abdominal pain, diarrhea, nausea and vomiting. Negative for blood in stool.  Genitourinary: Negative for dysuria.  Musculoskeletal: Positive for myalgias.  Neurological: Negative for syncope.  All other systems reviewed and are negative.   Physical Exam Updated Vital Signs BP 116/70   Pulse 60   Temp 98.6 F (37 C) (Oral)   Resp (!) 22   Ht 5\' 2"  (1.575 m)   Wt 71.7 kg   LMP 04/27/2020 Comment: pt shielded for CXR 05/13/20  SpO2 100%   BMI 28.91 kg/m   Physical Exam Vitals and nursing note reviewed.  Constitutional:      General: She is  not in acute distress.    Appearance: She is well-developed. She is not toxic-appearing.  HENT:     Head: Normocephalic and atraumatic.     Right Ear: Ear canal normal. Tympanic membrane is not perforated, erythematous, retracted or bulging.     Left Ear: Ear canal normal. Tympanic membrane is not perforated, erythematous, retracted or bulging.     Ears:     Comments: No mastoid erythema/swelling/tenderness.     Nose:     Right Sinus: No maxillary sinus tenderness or frontal sinus tenderness.     Left Sinus: No maxillary sinus tenderness or frontal sinus tenderness.     Mouth/Throat:     Pharynx: Uvula midline. No oropharyngeal exudate or posterior oropharyngeal erythema.     Comments: Posterior oropharynx is symmetric appearing. Patient tolerating own secretions without difficulty. No trismus. No drooling. No hot potato voice. No  swelling beneath the tongue, submandibular compartment is soft.  Eyes:     General:        Right eye: No discharge.        Left eye: No discharge.     Conjunctiva/sclera: Conjunctivae normal.     Pupils: Pupils are equal, round, and reactive to light.  Cardiovascular:     Rate and Rhythm: Normal rate and regular rhythm.     Heart sounds: No murmur heard.   Pulmonary:     Effort: Pulmonary effort is normal. No respiratory distress.     Breath sounds: Normal breath sounds. No wheezing, rhonchi or rales.  Chest:     Chest wall: Tenderness (lower anterior chest wall) present.  Abdominal:     General: There is no distension.     Palpations: Abdomen is soft.     Tenderness: There is abdominal tenderness (mild generalized). There is no guarding or rebound.  Musculoskeletal:     Cervical back: Normal range of motion and neck supple. No edema or rigidity.     Right lower leg: No edema.     Left lower leg: No edema.  Lymphadenopathy:     Cervical: No cervical adenopathy.  Skin:    General: Skin is warm and dry.     Findings: No rash.  Neurological:     Mental Status: She is alert.     Comments: Clear speech.   Psychiatric:        Behavior: Behavior normal.     ED Results / Procedures / Treatments   Labs (all labs ordered are listed, but only abnormal results are displayed) Labs Reviewed  SARS CORONAVIRUS 2 BY RT PCR ([place] [facility]) - Abnormal; Notable for the following components:      Result Value   SARS Coronavirus 2 POSITIVE (*)    All other components within normal limits  BASIC METABOLIC PANEL - Abnormal; Notable for the following components:   Glucose, Bld 145 (*)    All other components within normal limits  CBC  HEPATIC FUNCTION PANEL  LIPASE, BLOOD  I-STAT BETA HCG BLOOD, ED ([facility], [facility], [facility] ONLY)  TROPONIN I (HIGH SENSITIVITY)  TROPONIN I (HIGH SENSITIVITY)    EKG EKG Interpretation  Date/Time:  Wednesday May 14 2020  08:23:26 EDT Ventricular Rate:  58 PR Interval:    QRS Duration: 70 QT Interval:  440 QTC Calculation: 433 R Axis:   67 Text Interpretation: Sinus rhythm Confirmed by Sherwood Gambler [phone]) on 05/14/2020 8:28:14 AM   Radiology DG Chest Portable 1 View  Result Date: 05/13/2020 CLINICAL DATA:  Body chills and fever EXAM: PORTABLE CHEST 1 VIEW COMPARISON:  03/18/2020 FINDINGS: The heart size and mediastinal contours are within normal limits. Both lungs are clear. The visualized skeletal structures are unremarkable. IMPRESSION: No active disease. Electronically Signed   By: Donavan Foil M.D.   On: 05/13/2020 18:53    Procedures Procedures (including critical care time)  Medications Ordered in ED Medications  sodium chloride 0.9 % bolus 500 mL (0 mLs Intravenous Stopped 05/14/20 1013)  ondansetron (ZOFRAN) injection 4 mg (4 mg Intravenous Given 05/14/20 0849)  albuterol (VENTOLIN HFA) 108 (90 Base) MCG/ACT inhaler 2 puff (2 puffs Inhalation Given 05/14/20 0845)  ketorolac (TORADOL) 15 MG/ML injection 15 mg (15 mg Intravenous Given 05/14/20 1028)  dicyclomine (BENTYL) capsule 10 mg (10 mg Oral Given 05/14/20 1031)    ED Course  I have reviewed the triage vital signs and the nursing notes.  Pertinent labs & imaging results that were available during my care of the patient were reviewed by me and considered in my medical decision making (see chart for details).    Erlean Mealor was evaluated in Emergency Department on 05/14/2020 for the symptoms described in the history of present illness. He/she was evaluated in the context of the global COVID-19 pandemic, which necessitated consideration that the patient might be at risk for infection with the SARS-CoV-2 virus that causes COVID-19. Institutional protocols and algorithms that pertain to the evaluation of patients at risk for COVID-19 are in a state of rapid change based on information released by regulatory bodies including the CDC and federal and  state organizations. These policies and algorithms were followed during the patient's care in the ED.  MDM Rules/Calculators/A&P                         Patient presents to the ED with complaints of fever and generally not feeling well over the past 5 days.  She is nontoxic, resting comfortably, her vitals are generally unremarkable.  Lungs are clear.  She has chest wall and diffuse abdominal tenderness to palpation.  No areas of focal tenderness or peritoneal signs.  Additional history obtained:  Additional history obtained from chart review and nursing note reviewed.. Previous records obtained and reviewed  Lab Tests:  I reviewed and interpreted labs, which included:  CBC, BMP, pregnancy test: Unremarkable Covid test: Positive  Added hepatic function panel, lipase, and troponin as well as EKG ordered. Troponin: WNL x 2  Hepatic function panel & lipase: WNL EKG without STEMI.  Imaging Studies ordered:  Chest x-ray was ordered per triage team, I independently visualized and interpreted imaging which showed no acute process.  Patient was given fluids, Zofran, Toradol, and Bentyl with improvement in her symptoms.  She remains without any focal abdominal tenderness or peritoneal signs, low suspicion for acute surgical abdomen.  From a respiratory standpoint she does not appear to be in distress, she was able to ambulate and maintain her SPO2 at 99 to 100% on room air.  She is overall low risk [place], I have a low suspicion for pulmonary embolism with her lack of tachycardia or hypoxia.  EKG without ischemic changes and troponins are flat therefore doubt ACS.  No focal infiltrate on chest x-ray.  She has no pneumothorax.  Labs overall reassuring.  Overall suspect patient is symptomatic from her COVID-19 infection.  She does not appear to require admission at this time, appears appropriate for discharge home with symptomatic care and strict return precautions. I  discussed results, treatment plan,  need for follow-up, and return precautions with the patient. Provided opportunity for questions, patient confirmed understanding and is in agreement with plan.   Portions of this note were generated with [employer]. Dictation errors may occur despite best attempts at proofreading.  Final Clinical Impression(s) / ED Diagnoses Final diagnoses:  COVID-19  Shortness of breath  Chest pain, unspecified type  Generalized abdominal pain    Rx / DC Orders ED Discharge Orders         Ordered    ondansetron (ZOFRAN ODT) 4 MG disintegrating tablet  Every 8 hours PRN        05/14/20 1143    naproxen (NAPROSYN) 500 MG tablet  2 times daily PRN        05/14/20 1143    benzonatate (TESSALON) 100 MG capsule  Every 8 hours        05/14/20 1143    fluticasone (FLONASE) 50 MCG/ACT nasal spray  Daily PRN,   Status:  Discontinued        05/14/20 1143    dicyclomine (BENTYL) 20 MG tablet  Every 8 hours PRN        05/14/20 1143    fluticasone (FLONASE) 50 MCG/ACT nasal spray  Daily PRN        05/14/20 1144           Holbert Caples, [place] R, PA-C 05/14/20 1144    Veryl Speak, MD 05/14/20 1353

## 2020-05-14 NOTE — Discharge Planning (Signed)
RNCM consulted regarding transportation to home for Covid positive pt. RNCM provided safe transport Denice [place]) as pt has no transportation from hospital.

## 2020-05-14 NOTE — Telephone Encounter (Signed)
Called to discuss with [name] about Covid symptoms and the use of casirivimab/imdevimab, a combination monoclonal antibody infusion for those with mild to moderate Covid symptoms and at a high risk of hospitalization.     Pt is qualified for this infusion at the [facility] infusion center due to co-morbid conditions BMI>25.   Unable to reach. Message left and MyChart message sent.   Alda Lea, AGPCNP-BC

## 2020-05-14 NOTE — Discharge Instructions (Addendum)
You are seen in the emergency department today for respiratory and gastrointestinal symptoms, your COVID-19 test returned positive.  The rest of your testing was overall reassuring.  Your chest x-ray was normal.  Your labs did not show any significant abnormality.  Your EKG did not show signs of a heart attack.  You are sending you home with the following medicines to help with your symptoms: -Flonase: Use 1 spray per nostril daily as needed for nasal congestion/sinus pressure -Tessalon: Use every 8 hours as needed for coughing -Bentyl: Take every 8 hours as needed for abdominal cramping/spasms -Zofran: Take every 8 hours as needed for nausea and vomiting - Naproxen: this is a nonsteroidal anti-inflammatory medication that will help with pain and swelling. Be sure to take this medication as prescribed with food, 1 pill every 12 hours,  It should be taken with food, as it can cause stomach upset, and more seriously, stomach bleeding. Do not take other nonsteroidal anti-inflammatory medications with this such as Advil, Motrin, Aleve, Mobic, Goodie Powder, or Motrin.   You make take Tylenol per over the counter dosing with these medications.   You may use the inhaler provided in the ER 1 to 2 puffs every 4-6 hours as needed for trouble breathing/wheezing.  We have prescribed you new medication(s) today. Discuss the medications prescribed today with your pharmacist as they can have adverse effects and interactions with your other medicines including over the counter and prescribed medications. Seek medical evaluation if you start to experience new or abnormal symptoms after taking one of these medicines, seek care immediately if you start to experience difficulty breathing, feeling of your throat closing, facial swelling, or rash as these could be indications of a more serious allergic reaction  We are instructing patient's with COVID 19 or symptoms of COVID 19 to quarantine themselves for 14 days. You may  be able to discontinue self quarantine if the following conditions are met:   Persons with COVID-19 who have symptoms and were directed to care for themselves at home may discontinue home isolation under the  following conditions: - It has been at least 7 days have passed since symptoms first appeared. - AND at least 3 days (72 hours) have passed since recovery defined as resolution of fever without the use of fever-reducing medications and improvement in respiratory symptoms (e.g., cough, shortness of breath)  Please follow the below quarantine instructions.   Please follow up with primary care within 3-5 days for re-evaluation- call prior to going to the office to make them aware of your symptoms as some offices are altering their method of seeing patients with COVID 19 symptoms. Return to the ER for new or worsening symptoms including but not limited to increased work of breathing, chest pain, passing out, worsening abdominal pain, pain in new location, coughing up blood, or any other concerns.       Person Under Monitoring Name: Jessica Stanton  Location: [place] [address]   Infection Prevention Recommendations for Individuals Confirmed to have, or Being Evaluated for, 2019 Novel Coronavirus (COVID-19) Infection Who Receive Care at Home  Individuals who are confirmed to have, or are being evaluated for, COVID-19 should follow the prevention steps below until a healthcare provider or local or state health department says they can return to normal activities.  Stay home except to get medical care You should restrict activities outside your home, except for getting medical care. Do not go to work, school, or public areas, and  do not use public transportation or taxis.  Call ahead before visiting your doctor Before your medical appointment, call the healthcare provider and tell them that you have, or are being evaluated for, COVID-19 infection. This will  help the healthcare provider's office take steps to keep other people from getting infected. Ask your healthcare provider to call the local or state health department.  Monitor your symptoms Seek prompt medical attention if your illness is worsening (e.g., difficulty breathing). Before going to your medical appointment, call the healthcare provider and tell them that you have, or are being evaluated for, COVID-19 infection. Ask your healthcare provider to call the local or state health department.  Wear a facemask You should wear a facemask that covers your nose and mouth when you are in the same room with other people and when you visit a healthcare provider. People who live with or visit you should also wear a facemask while they are in the same room with you.  Separate yourself from other people in your home As much as possible, you should stay in a different room from other people in your home. Also, you should use a separate bathroom, if available.  Avoid sharing household items You should not share dishes, drinking glasses, cups, eating utensils, towels, bedding, or other items with other people in your home. After using these items, you should wash them thoroughly with soap and water.  Cover your coughs and sneezes Cover your mouth and nose with a tissue when you cough or sneeze, or you can cough or sneeze into your sleeve. Throw used tissues in a lined trash can, and immediately wash your hands with soap and water for at least 20 seconds or use an alcohol-based hand rub.  Wash your [facility] your hands often and thoroughly with soap and water for at least 20 seconds. You can use an alcohol-based hand sanitizer if soap and water are not available and if your hands are not visibly dirty. Avoid touching your eyes, nose, and mouth with unwashed hands.   Prevention Steps for Caregivers and Household Members of Individuals Confirmed to have, or Being Evaluated for, COVID-19  Infection Being Cared for in the Home  If you live with, or provide care at home for, a person confirmed to have, or being evaluated for, COVID-19 infection please follow these guidelines to prevent infection:  Follow healthcare provider's instructions Make sure that you understand and can help the patient follow any healthcare provider instructions for all care.  Provide for the patient's basic needs You should help the patient with basic needs in the home and provide support for getting groceries, prescriptions, and other personal needs.  Monitor the patient's symptoms If they are getting sicker, call his or her medical provider and tell them that the patient has, or is being evaluated for, COVID-19 infection. This will help the healthcare provider's office take steps to keep other people from getting infected. Ask the healthcare provider to call the local or state health department.  Limit the number of people who have contact with the patient If possible, have only one caregiver for the patient. Other household members should stay in another home or place of residence. If this is not possible, they should stay in another room, or be separated from the patient as much as possible. Use a separate bathroom, if available. Restrict visitors who do not have an essential need to be in the home.  Keep older adults, very young children, and other sick people  away from the patient Keep older adults, very young children, and those who have compromised immune systems or chronic health conditions away from the patient. This includes people with chronic heart, lung, or kidney conditions, diabetes, and cancer.  Ensure good ventilation Make sure that shared spaces in the home have good air flow, such as from an air conditioner or an opened window, weather permitting.  Wash your hands often Wash your hands often and thoroughly with soap and water for at least 20 seconds. You can use an alcohol  based hand sanitizer if soap and water are not available and if your hands are not visibly dirty. Avoid touching your eyes, nose, and mouth with unwashed hands. Use disposable paper towels to dry your hands. If not available, use dedicated cloth towels and replace them when they become wet.  Wear a facemask and gloves Wear a disposable facemask at all times in the room and gloves when you touch or have contact with the patient's blood, body fluids, and/or secretions or excretions, such as sweat, saliva, sputum, nasal mucus, vomit, urine, or feces.  Ensure the mask fits over your nose and mouth tightly, and do not touch it during use. Throw out disposable facemasks and gloves after using them. Do not reuse. Wash your hands immediately after removing your facemask and gloves. If your personal clothing becomes contaminated, carefully remove clothing and launder. Wash your hands after handling contaminated clothing. Place all used disposable facemasks, gloves, and other waste in a lined container before disposing them with other household waste. Remove gloves and wash your hands immediately after handling these items.  Do not share dishes, glasses, or other household items with the patient Avoid sharing household items. You should not share dishes, drinking glasses, cups, eating utensils, towels, bedding, or other items with a patient who is confirmed to have, or being evaluated for, COVID-19 infection. After the person uses these items, you should wash them thoroughly with soap and water.  Wash laundry thoroughly Immediately remove and wash clothes or bedding that have blood, body fluids, and/or secretions or excretions, such as sweat, saliva, sputum, nasal mucus, vomit, urine, or feces, on them. Wear gloves when handling laundry from the patient. Read and follow directions on labels of laundry or clothing items and detergent. In general, wash and dry with the warmest temperatures recommended on the  label.  Clean all areas the individual has used often Clean all touchable surfaces, such as counters, tabletops, doorknobs, bathroom fixtures, toilets, phones, keyboards, tablets, and bedside tables, every day. Also, clean any surfaces that may have blood, body fluids, and/or secretions or excretions on them. Wear gloves when cleaning surfaces the patient has come in contact with. Use a diluted bleach solution (e.g., dilute bleach with 1 part bleach and 10 parts water) or a household disinfectant with a label that says EPA-registered for coronaviruses. To make a bleach solution at home, add 1 tablespoon of bleach to 1 quart (4 cups) of water. For a larger supply, add  cup of bleach to 1 gallon (16 cups) of water. Read labels of cleaning products and follow recommendations provided on product labels. Labels contain instructions for safe and effective use of the cleaning product including precautions you should take when applying the product, such as wearing gloves or eye protection and making sure you have good ventilation during use of the product. Remove gloves and wash hands immediately after cleaning.  Monitor yourself for signs and symptoms of illness Caregivers and household members are considered  close contacts, should monitor their health, and will be asked to limit movement outside of the home to the extent possible. Follow the monitoring steps for close contacts listed on the symptom monitoring form.   ? If you have additional questions, contact your local health department or call the epidemiologist on call at [phone] (available 24/7). ? This guidance is subject to change. For the most up-to-date guidance from [facility], please refer to their website: [email or web address]

## 2020-05-14 NOTE — ED Notes (Signed)
Pt has signed a form for Safe transport to leave with her brother and herself to be transported home together.

## 2020-09-19 ENCOUNTER — Emergency Department (HOSPITAL_COMMUNITY): Payer: BC Managed Care – PPO

## 2020-09-19 ENCOUNTER — Emergency Department (HOSPITAL_COMMUNITY)
Admission: EM | Admit: 2020-09-19 | Discharge: 2020-09-19 | Disposition: A | Discharge status: Home / Self Care | Payer: BC Managed Care – PPO | Attending: Emergency Medicine | Admitting: Emergency Medicine

## 2020-09-19 ENCOUNTER — Other Ambulatory Visit: Payer: Self-pay

## 2020-09-19 DIAGNOSIS — N739 Female pelvic inflammatory disease, unspecified: Secondary | ICD-10-CM

## 2020-09-19 DIAGNOSIS — N939 Abnormal uterine and vaginal bleeding, unspecified: Secondary | ICD-10-CM | POA: Insufficient documentation

## 2020-09-19 DIAGNOSIS — Z20822 Contact with and (suspected) exposure to covid-19: Secondary | ICD-10-CM | POA: Diagnosis not present

## 2020-09-19 DIAGNOSIS — M5430 Sciatica, unspecified side: Secondary | ICD-10-CM | POA: Diagnosis not present

## 2020-09-19 DIAGNOSIS — Z87891 Personal history of nicotine dependence: Secondary | ICD-10-CM | POA: Diagnosis not present

## 2020-09-19 DIAGNOSIS — R1030 Lower abdominal pain, unspecified: Secondary | ICD-10-CM | POA: Insufficient documentation

## 2020-09-19 DIAGNOSIS — D259 Leiomyoma of uterus, unspecified: Secondary | ICD-10-CM | POA: Diagnosis not present

## 2020-09-19 DIAGNOSIS — M545 Low back pain, unspecified: Secondary | ICD-10-CM | POA: Diagnosis present

## 2020-09-19 LAB — CBC WITH DIFFERENTIAL/PLATELET
Abs Immature Granulocytes: 0.02 10*3/uL (ref 0.00–0.07)
Basophils Absolute: 0.1 10*3/uL (ref 0.0–0.1)
Basophils Relative: 1 %
Eosinophils Absolute: 0.2 10*3/uL (ref 0.0–0.5)
Eosinophils Relative: 2 %
HCT: 37.1 % (ref 36.0–46.0)
Hemoglobin: 12.8 g/dL (ref 12.0–15.0)
Immature Granulocytes: 0 %
Lymphocytes Relative: 35 %
Lymphs Abs: 3.5 10*3/uL (ref 0.7–4.0)
MCH: 30.5 pg (ref 26.0–34.0)
MCHC: 34.5 g/dL (ref 30.0–36.0)
MCV: 88.5 fL (ref 80.0–100.0)
Monocytes Absolute: 0.6 10*3/uL (ref 0.1–1.0)
Monocytes Relative: 6 %
Neutro Abs: 5.7 10*3/uL (ref 1.7–7.7)
Neutrophils Relative %: 56 %
Platelets: 257 10*3/uL (ref 150–400)
RBC: 4.19 MIL/uL (ref 3.87–5.11)
RDW: 12.8 % (ref 11.5–15.5)
WBC: 10 10*3/uL (ref 4.0–10.5)
nRBC: 0 % (ref 0.0–0.2)

## 2020-09-19 LAB — COMPREHENSIVE METABOLIC PANEL
ALT: 16 U/L (ref 0–44)
AST: 17 U/L (ref 15–41)
Albumin: 3.7 g/dL (ref 3.5–5.0)
Alkaline Phosphatase: 49 U/L (ref 38–126)
Anion gap: 9 (ref 5–15)
BUN: 13 mg/dL (ref 6–20)
CO2: 23 mmol/L (ref 22–32)
Calcium: 8.5 mg/dL — ABNORMAL LOW (ref 8.9–10.3)
Chloride: 105 mmol/L (ref 98–111)
Creatinine, Ser: 0.78 mg/dL (ref 0.44–1.00)
GFR, Estimated: >60 mL/min (ref >60)
Glucose, Bld: 83 mg/dL (ref 70–99)
Potassium: 3.6 mmol/L (ref 3.5–5.1)
Sodium: 137 mmol/L (ref 135–145)
Total Bilirubin: 0.3 mg/dL (ref 0.3–1.2)
Total Protein: 6.3 g/dL — ABNORMAL LOW (ref 6.5–8.1)

## 2020-09-19 LAB — WET PREP, GENITAL
Clue Cells Wet Prep HPF POC: NONE SEEN
Sperm: NONE SEEN
Trich, Wet Prep: NONE SEEN
Yeast Wet Prep HPF POC: NONE SEEN

## 2020-09-19 LAB — URINALYSIS, ROUTINE W REFLEX MICROSCOPIC
Bacteria, UA: NONE SEEN
Bilirubin Urine: NEGATIVE
Glucose, UA: NEGATIVE mg/dL
Ketones, ur: NEGATIVE mg/dL
Leukocytes,Ua: NEGATIVE
Nitrite: NEGATIVE
Protein, ur: NEGATIVE mg/dL
RBC / HPF: >50 RBC/hpf — ABNORMAL HIGH (ref 0–5)
Specific Gravity, Urine: 1.023 (ref 1.005–1.030)
pH: 5 (ref 5.0–8.0)

## 2020-09-19 LAB — POC SARS CORONAVIRUS 2 AG -  ED: SARS Coronavirus 2 Ag: NEGATIVE

## 2020-09-19 LAB — HIV ANTIBODY (ROUTINE TESTING W REFLEX): HIV Screen 4th Generation wRfx: NONREACTIVE

## 2020-09-19 LAB — I-STAT BETA HCG BLOOD, ED (MC, WL, AP ONLY): I-stat hCG, quantitative: <5 m[IU]/mL (ref <5)

## 2020-09-19 MED ORDER — KETOROLAC TROMETHAMINE 30 MG/ML IJ SOLN
30.0000 mg | Freq: Once | INTRAMUSCULAR | Status: AC
  Administered 2020-09-19: 30 mg via INTRAVENOUS
  Filled 2020-09-19: qty 1

## 2020-09-19 NOTE — Discharge Instructions (Signed)
Your MRI today is reassuring.  As we discussed, your symptoms may be related to sciatica.  Continue taking the muscle relaxers as directed.  Take the steroids as directed.  You can take Tylenol or Ibuprofen as directed for pain. You can alternate Tylenol and Ibuprofen every 4 hours. If you take Tylenol at 1pm, then you can take Ibuprofen at 5pm. Then you can take Tylenol again at 9pm.   Additionally, as we discussed, you may have some degree of pelvic inflammatory disease given your pelvic exam.  The urgent care prescribed antibiotics.  Is very important that he continue taking them.  You have gonorrhea/chlamydia, HIV, syphilis test pending.  If you are positive, they will notify you.  These usually take about 2 to 3 days to return.  If you have not heard anything, can we check on MyChart app or online regarding results.  He should not have any intercourse until he finished antibiotics.  Return to emergency department for any worsening back pain, inability to feel your arms or legs, inability to walk, urinating or having bowel movements on yourself, fevers, chest pain, difficulty breathing.

## 2020-09-19 NOTE — ED Notes (Signed)
Patient transported to CT 

## 2020-09-19 NOTE — ED Notes (Signed)
Pt transported to MRI 

## 2020-09-19 NOTE — ED Triage Notes (Signed)
Pt seen yesterday and treated for STD. States still having the same symptoms.

## 2020-09-19 NOTE — ED Provider Notes (Signed)
[facility] EMERGENCY DEPARTMENT Provider Note   CSN: [id number] Arrival date & time: 09/19/20  1054     History No chief complaint on file.   Jessica Stanton is a 45 y.o. female with PMH/o Back pain, depression, migraine, scoliosis, spina bifidia who presents for evaluation of lower back pain that has been ongoing since 12/29. Patient reports that she was was digging a grave for her cat that died and states that that night, she started having some pain in her lower back.  She was taking some natural supplements to help with pain but states that it continued to persist.  She also started having some lower abdominal pain and pelvic pain.  She was seen at urgent care yesterday and was evaluated for her symptoms.  She was told to go to the emergency department for further evaluation.  Patient reports that she feels like the pain radiates from her back into her pelvis region.  She states that she occasionally will get pain in her vagina.  Her LMP was about a week ago.  She states that when she urinates, it makes the pain worse.  She states that the pain in her back goes down the posterior aspect of her right lower leg.  Occasionally, she will feel some tingling sensation in her bilateral toes.  She has been able to ambulate.  She does report that the pain is worse with ambulation.  Her back pain is also worse when she moves and bends.  She reports that yesterday when she was getting ready to go to urgent care, she had one episode of urinary incontinence.  She states she was walking down the hallway and noticed that she had urinated on herself and that she had not felt it.  She had not had any other episodes of urinary or bowel incontinence.  No saddle anesthesia.  She denies any fall, trauma, injury.  At urgent care yesterday, they treated her for STDs as well as gave her Robaxin.  They sent her to the emergency department for further evaluation.  Patient reports she had not had any vaginal  discharge.  She had had some occasional vaginal bleeding.  She reports she is currently sexually active with one partner.  They do not use protection.  She states that that partner was known to have multiple partners.  Denies fevers, weight loss, numbness/weakness of upper and lower extremities, saddle anesthesia, history of back surgery, history of IVDA.  She denies any fevers, chest pain, difficulty breathing, nausea/vomiting.  The history is provided by the patient.       Past Medical History:  Diagnosis Date  . Back pain   . Depression   . Migraine   . Scoliosis   . Spina bifida [facility])     Patient Active Problem List   Diagnosis Date Noted  . Tick bite of abdomen 03/13/2018    Past Surgical History:  Procedure Laterality Date  . ABDOMINOPLASTY    . BREAST ENHANCEMENT SURGERY  03/26/2015     OB History    Gravida  4   Para  1   Term  1   Preterm      AB  3   Living  0     SAB  3   IAB      Ectopic      Multiple      Live Births  1           Family History  Problem Relation Age  of Onset  . Diabetes Other        Other Blood Relative  . Thyroid cancer Mother   . Heart disease Other     Social History   Tobacco Use  . Smoking status: Former Research scientist (life sciences)  . Smokeless tobacco: Never Used  Substance Use Topics  . Alcohol use: No    Alcohol/week: 0.0 standard drinks  . Drug use: No    Home Medications Prior to Admission medications   Medication Sig Start Date End Date Taking? Authorizing Provider  acetaminophen (TYLENOL) 500 MG tablet Take 1,000 mg by mouth every 6 (six) hours as needed (pain).   Yes [provider]  doxycycline (VIBRA-TABS) 100 MG tablet Take 100 mg by mouth 2 (two) times daily. 09/18/20  Yes [provider]  Menthol, Topical Analgesic, (BIOFREEZE EX) Apply 1 application topically 3 (three) times daily.   Yes [provider]  OVER THE COUNTER MEDICATION Place 1 drop into both eyes daily as needed  (allergies/itching). Over the counter allergy eye drops   Yes [provider]  OVER THE COUNTER MEDICATION Take 2 tablets by mouth daily as needed (pain). Curamin - for pain   Yes [provider]  predniSONE (DELTASONE) 20 MG tablet Take 40 mg by mouth every evening. 09/18/20  Yes [provider]  spironolactone (ALDACTONE) 50 MG tablet Take 50 mg by mouth daily.   Yes [provider]  tiZANidine (ZANAFLEX) 2 MG tablet Take 2 mg by mouth daily. 09/18/20  Yes [provider]    Allergies    Morphine and related, Penicillin g, and Shellfish allergy  Review of Systems   Review of Systems  Constitutional: Negative for fever.  Respiratory: Negative for cough and shortness of breath.   Cardiovascular: Negative for chest pain.  Gastrointestinal: Negative for abdominal pain, nausea and vomiting.  Genitourinary: Positive for frequency. Negative for dysuria and hematuria.  Musculoskeletal: Positive for back pain.  Neurological: Negative for headaches.  All other systems reviewed and are negative.   Physical Exam Updated Vital Signs BP 115/69   Pulse (!) 55   Temp 98.7 F (37.1 C) (Oral)   Resp 18   Ht 5\' 2"  (1.575 m)   Wt 68 kg   SpO2 98%   BMI 27.44 kg/m   Physical Exam Vitals and nursing note reviewed. Exam conducted with a chaperone present.  Constitutional:      Appearance: Normal appearance. She is well-developed and well-nourished.  HENT:     Head: Normocephalic and atraumatic.     Mouth/Throat:     Mouth: Oropharynx is clear and moist and mucous membranes are normal.  Eyes:     General: Lids are normal.     Extraocular Movements: EOM normal.     Conjunctiva/sclera: Conjunctivae normal.     Pupils: Pupils are equal, round, and reactive to light.  Neck:     Comments: Full flexion/extension and lateral movement of neck fully intact. No bony midline tenderness. No deformities or crepitus.  Cardiovascular:     Rate and Rhythm: Normal  rate and regular rhythm.     Pulses: Normal pulses.     Heart sounds: Normal heart sounds. No murmur heard. No friction rub. No gallop.   Pulmonary:     Effort: Pulmonary effort is normal.     Breath sounds: Normal breath sounds.     Comments: Lungs clear to auscultation bilaterally.  Symmetric chest rise.  No wheezing, rales, rhonchi. Abdominal:     Palpations: Abdomen  is soft. Abdomen is not rigid.     Tenderness: There is abdominal tenderness in the suprapubic area. There is no guarding.     Comments: Mild suprapubic abdominal tenderness.  No rigidity, guarding.  Genitourinary:    Vagina: Bleeding present.     Cervix: Cervical motion tenderness present. No discharge.     Comments: The exam was performed with a chaperone present Marye Round, Therapist, sports). Normal external female genitalia. No lesions, rash, or sores.  Small amount of bleeding noted in the vaginal vault.  No hemorrhage.  Limited visualization of the cervix secondary to bleeding.  Patient did have CMT.  No adnexal mass or tenderness noted bilaterally. Musculoskeletal:        General: Normal range of motion.     Cervical back: Full passive range of motion without pain.       Back:     Comments: No midline T-spine tenderness.  Tenderness diffusely noted lower lumbar region that extends paraspinal muscles of the right and into the gluteal region.  Skin:    General: Skin is warm and dry.     Capillary Refill: Capillary refill takes less than 2 seconds.  Neurological:     Mental Status: She is alert and oriented to person, place, and time.     Comments: Cranial nerves III-XII intact Follows commands, Moves all extremities  5/5 strength to BUE and BLE  Dorsiflexion/plantarflexion intact bilaterally.  Sensation intact throughout all major nerve distributions No gait abnormalities  No slurred speech. No facial droop.  Positive SLR on right  Psychiatric:        Mood and Affect: Mood and affect normal.        Speech: Speech normal.      ED Results / Procedures / Treatments   Labs (all labs ordered are listed, but only abnormal results are displayed) Labs Reviewed  WET PREP, GENITAL - Abnormal; Notable for the following components:      Result Value   WBC, Wet Prep HPF POC MANY (*)    All other components within normal limits  URINALYSIS, ROUTINE W REFLEX MICROSCOPIC - Abnormal; Notable for the following components:   APPearance HAZY (*)    Hgb urine dipstick MODERATE (*)    RBC / HPF >50 (*)    All other components within normal limits  COMPREHENSIVE METABOLIC PANEL - Abnormal; Notable for the following components:   Calcium 8.5 (*)    Total Protein 6.3 (*)    All other components within normal limits  CBC WITH DIFFERENTIAL/PLATELET  HIV ANTIBODY (ROUTINE TESTING W REFLEX)  PREGNANCY, URINE  RPR  POC SARS CORONAVIRUS 2 AG -  ED  I-STAT BETA HCG BLOOD, ED ([facility], [facility], [facility] ONLY)  GC/CHLAMYDIA PROBE AMP ([place]) NOT AT [facility]    EKG None  Radiology MR LUMBAR SPINE WO CONTRAST  Result Date: 09/19/2020 CLINICAL DATA:  Acute low back pain, leg pain and numbness greater on right EXAM: MRI LUMBAR SPINE WITHOUT CONTRAST TECHNIQUE: Multiplanar, multisequence MR imaging of the lumbar spine was performed. No intravenous contrast was administered. COMPARISON:  None. FINDINGS: Segmentation:  Standard. Alignment:  Preserved. Vertebrae: Vertebral body heights are maintained. No marrow edema. No suspicious osseous lesion. Conus medullaris and cauda equina: Conus extends to the L1-L2 level. Conus and cauda equina appear normal. Paraspinal and other soft tissues: Left kidney is not visualized. Disc levels: Intervertebral disc heights and signal are maintained. There is no disc herniation or canal or foraminal stenosis at any level. IMPRESSION: No compression  deformity or significant degenerative changes. Electronically Signed   By: Macy Mis M.D.   On: 09/19/2020 14:32   CT Renal Stone Study  Result Date:  09/19/2020 CLINICAL DATA:  45 year old female with hematuria. EXAM: CT ABDOMEN AND PELVIS WITHOUT CONTRAST TECHNIQUE: Multidetector CT imaging of the abdomen and pelvis was performed following the standard protocol without IV contrast. COMPARISON:  None. FINDINGS: Evaluation of this exam is limited in the absence of intravenous contrast. Lower chest: The visualized lung bases are clear. No intra-abdominal free air or free fluid. Hepatobiliary: No focal liver abnormality is seen. No gallstones, gallbladder wall thickening, or biliary dilatation. Pancreas: Unremarkable. No pancreatic ductal dilatation or surrounding inflammatory changes. Spleen: Normal in size without focal abnormality. Adrenals/Urinary Tract: There is a solitary right kidney. There is no hydronephrosis or nephrolithiasis. The visualized ureter and urinary bladder appear unremarkable. Stomach/Bowel: There is moderate stool throughout the colon. There is no bowel obstruction or active inflammation. The appendix is normal. Vascular/Lymphatic: The abdominal aorta and IVC are unremarkable on this noncontrast CT. No portal venous gas. There is no adenopathy. Reproductive: The uterus is anteverted. And ill-defined 1.5 x 2.0 cm ovoid hypodense lesion in the left posterior hemipelvis (66/3 and coronal 70/6) is not characterized and may represent a partially exophytic fibroid. Pelvic ultrasound may provide better evaluation. No adnexal masses. Other: Partially visualized bilateral breast implants. Musculoskeletal: No acute or significant osseous findings. IMPRESSION: 1. No acute intra-abdominal or pelvic pathology. No hydronephrosis or nephrolithiasis. 2. Solitary right kidney. 3. Moderate colonic stool burden. No bowel obstruction. Normal appendix. 4. A 1.5 x 2.0 cm ovoid hypodense lesion in the left posterior hemipelvis may represent a partially exophytic fibroid. Pelvic ultrasound may provide better evaluation. Electronically Signed   By: Anner Crete  M.D.   On: 09/19/2020 17:02    Procedures Procedures (including critical care time)  Medications Ordered in ED Medications  ketorolac (TORADOL) 30 MG/ML injection 30 mg (30 mg Intravenous Given 09/19/20 1539)    ED Course  I have reviewed the triage vital signs and the nursing notes.  Pertinent labs & imaging results that were available during my care of the patient were reviewed by me and considered in my medical decision making (see chart for details).    MDM Rules/Calculators/A&P                          45 year old female who presents for evaluation of back pain.  Was seen at urgent care yesterday.  Was advised to come to the ED but she did not.  Comes today because she is still feeling bad.  She reports pain in her lower back.  She feels like it radiates into her right leg.  She has some tingling in her toes but otherwise no weakness.  She has been able to ambulate.  On initial ED arrival, she is afebrile and nontoxic-appearing.  Vital stable.  On exam, she has tenderness palpation noted diffusely to the lower lumbar region that extends into the right paraspinal muscles into the gluteal region.  She also has positive straight leg raise.  Exam consistent with sciatica.  She does not exhibit any focal weakness.  Additionally, she has some mild suprapubic abdominal tenderness.  Plan check labs, urine as well as do a pelvic exam.  I reviewed her paperwork from urgent care.  They sent her over for evaluation of possible cauda equina.  She did have one episode of urinary incontinence yesterday.  She  has not had any since then.  My suspicion for cauda equina is very low given her current exam but given that she has had urinary incontinence as well as lower back pain and this was a concern from urgent care, we will obtain MRI for further evaluation.  She otherwise does not have any risk factors.  Do not suspect spinal abscess, epidural abscess.  Pelvic exam as documented above.  She did have some  mild CMT that would be concerning for PID.  She also had some vaginal bleeding.  No hemorrhage.  No adnexal mass or tenderness noted bilaterally.  I reviewed her paperwork from urgent care and they gave her Rocephin yesterday and discharged her home with doxycycline that she has already had treatment for PID.  We will plan to add on RPR and HIV.  UA shows moderate hemoglobin.  No infectious etiology.  CMP is unremarkable.  CBC shows no leukocytosis or anemia.  I-STAT beta is negative.  Wet prep shows white blood cells.  Otherwise unremarkable.  COVID test is negative.  MRI shows no compression deformity or significant degenerative changes. Conus and Cauda equina appear normal. CT renal study shows no acute intra-abdominal or pelvic pathology.  She has some moderate colonic stool burden.  There is a 1.5 x 2.0 cm ovoid hypodense lesion left foot.  Hemipelvis could be a partially exophytic fibroid.  At this time, there are no adnexal masses.  At this time, patient stable for outpatient ultrasound.  We will give her outpatient OB/GYN.   I discussed results with patient.  Patient has been ambulatory.  Personally evaluated patient ambulating to the bathroom and has no signs of gait abnormality.  She is already receiving treatment for PID and already has steroids, muscle relaxers.  At this time, believe her symptoms are likely related to sciatica.  I discussed with her that she has gonorrhea/chlamydia as well as HIV and syphilis test pending. At this time, patient exhibits no emergent life-threatening condition that require further evaluation in ED. Patient had ample opportunity for questions and discussion. All patient's questions were answered with full understanding. Strict return precautions discussed. Patient expresses understanding and agreement to plan.    Portions of this note were generated with Lobby[employer]. Dictation errors may occur despite best attempts at proofreading   Final Clinical  Impression(s) / ED Diagnoses Final diagnoses:  Sciatica, unspecified laterality  Pelvic inflammatory disease  Uterine leiomyoma, unspecified location    Rx / DC Orders ED Discharge Orders    None       Desma Mcgregor 09/19/20 1924    Wyvonnia Dusky, MD 09/20/20 [phone]

## 2020-09-20 LAB — RPR: RPR Ser Ql: NONREACTIVE

## 2020-09-22 LAB — GC/CHLAMYDIA PROBE AMP (~~LOC~~) NOT AT ARMC
Chlamydia: NEGATIVE
Comment: NEGATIVE
Comment: NORMAL
Neisseria Gonorrhea: NEGATIVE

## 2020-10-29 ENCOUNTER — Encounter: Payer: BC Managed Care – PPO | Admitting: Obstetrics & Gynecology

## 2020-11-20 ENCOUNTER — Ambulatory Visit: Payer: Self-pay

## 2020-11-21 ENCOUNTER — Other Ambulatory Visit: Payer: Self-pay

## 2020-11-21 ENCOUNTER — Ambulatory Visit (LOCAL_COMMUNITY_HEALTH_CENTER): Payer: Self-pay

## 2020-11-21 DIAGNOSIS — Z23 Encounter for immunization: Secondary | ICD-10-CM | POA: Diagnosis not present

## 2020-11-21 NOTE — Progress Notes (Signed)
Requests Hep B vaccine. Reports she has had 2 previous doses of Hep B vaccine at [facility], [place], but no documentation with her today. Phone call by pt to [facility] while at visit and [place] faxed vaccine record to [facility]. [facility] updated with prior Hep B vaccines and Hep B vaccine today. Tolerated well. Updated [facility] copy given to pt. Josie Saunders, RN

## 2021-05-15 ENCOUNTER — Other Ambulatory Visit: Payer: Self-pay

## 2021-05-15 DIAGNOSIS — L700 Acne vulgaris: Secondary | ICD-10-CM | POA: Diagnosis not present

## 2021-05-15 DIAGNOSIS — L730 Acne keloid: Secondary | ICD-10-CM | POA: Diagnosis not present

## 2021-05-15 DIAGNOSIS — Z79899 Other long term (current) drug therapy: Secondary | ICD-10-CM | POA: Diagnosis not present

## 2021-05-15 MED ORDER — SPIRONOLACTONE 50 MG PO TABS
ORAL_TABLET | ORAL | 4 refills | Status: AC
  Filled 2021-05-15 – 2021-06-30 (×3): qty 30, 30d supply, fill #0

## 2021-05-15 MED ORDER — TRETINOIN 0.025 % EX CREA
TOPICAL_CREAM | CUTANEOUS | 2 refills | Status: AC
  Filled 2021-05-15 – 2021-12-18 (×4): qty 45, 30d supply, fill #0

## 2021-05-27 ENCOUNTER — Other Ambulatory Visit: Payer: Self-pay

## 2021-06-04 ENCOUNTER — Other Ambulatory Visit: Payer: Self-pay

## 2021-06-10 ENCOUNTER — Other Ambulatory Visit: Payer: Self-pay

## 2021-06-23 ENCOUNTER — Other Ambulatory Visit: Payer: Self-pay

## 2021-06-30 ENCOUNTER — Other Ambulatory Visit: Payer: Self-pay

## 2021-07-21 DIAGNOSIS — Z3040 Encounter for surveillance of contraceptives, unspecified: Secondary | ICD-10-CM | POA: Diagnosis not present

## 2021-07-21 DIAGNOSIS — N971 Female infertility of tubal origin: Secondary | ICD-10-CM | POA: Diagnosis not present

## 2021-07-21 DIAGNOSIS — Z113 Encounter for screening for infections with a predominantly sexual mode of transmission: Secondary | ICD-10-CM | POA: Diagnosis not present

## 2021-07-21 DIAGNOSIS — N912 Amenorrhea, unspecified: Secondary | ICD-10-CM | POA: Diagnosis not present

## 2021-07-21 DIAGNOSIS — N760 Acute vaginitis: Secondary | ICD-10-CM | POA: Diagnosis not present

## 2021-10-08 DIAGNOSIS — N926 Irregular menstruation, unspecified: Secondary | ICD-10-CM | POA: Diagnosis not present

## 2021-10-08 DIAGNOSIS — R102 Pelvic and perineal pain: Secondary | ICD-10-CM | POA: Diagnosis not present

## 2021-10-08 DIAGNOSIS — Z3202 Encounter for pregnancy test, result negative: Secondary | ICD-10-CM | POA: Diagnosis not present

## 2021-10-08 DIAGNOSIS — D259 Leiomyoma of uterus, unspecified: Secondary | ICD-10-CM | POA: Diagnosis not present

## 2021-11-06 DIAGNOSIS — Z113 Encounter for screening for infections with a predominantly sexual mode of transmission: Secondary | ICD-10-CM | POA: Diagnosis not present

## 2021-11-06 DIAGNOSIS — N912 Amenorrhea, unspecified: Secondary | ICD-10-CM | POA: Diagnosis not present

## 2021-11-06 DIAGNOSIS — Z01419 Encounter for gynecological examination (general) (routine) without abnormal findings: Secondary | ICD-10-CM | POA: Diagnosis not present

## 2021-11-06 DIAGNOSIS — Z6826 Body mass index (BMI) 26.0-26.9, adult: Secondary | ICD-10-CM | POA: Diagnosis not present

## 2021-11-06 DIAGNOSIS — Z309 Encounter for contraceptive management, unspecified: Secondary | ICD-10-CM | POA: Diagnosis not present

## 2021-11-23 DIAGNOSIS — N979 Female infertility, unspecified: Secondary | ICD-10-CM | POA: Diagnosis not present

## 2021-11-23 DIAGNOSIS — Z319 Encounter for procreative management, unspecified: Secondary | ICD-10-CM | POA: Diagnosis not present

## 2021-12-07 DIAGNOSIS — N979 Female infertility, unspecified: Secondary | ICD-10-CM | POA: Diagnosis not present

## 2021-12-07 DIAGNOSIS — Z3141 Encounter for fertility testing: Secondary | ICD-10-CM | POA: Diagnosis not present

## 2021-12-07 DIAGNOSIS — Z3202 Encounter for pregnancy test, result negative: Secondary | ICD-10-CM | POA: Diagnosis not present

## 2021-12-07 DIAGNOSIS — Z319 Encounter for procreative management, unspecified: Secondary | ICD-10-CM | POA: Diagnosis not present

## 2021-12-14 DIAGNOSIS — J209 Acute bronchitis, unspecified: Secondary | ICD-10-CM | POA: Diagnosis not present

## 2021-12-14 DIAGNOSIS — Z20822 Contact with and (suspected) exposure to covid-19: Secondary | ICD-10-CM | POA: Diagnosis not present

## 2021-12-16 DIAGNOSIS — Z319 Encounter for procreative management, unspecified: Secondary | ICD-10-CM | POA: Diagnosis not present

## 2021-12-16 DIAGNOSIS — E2839 Other primary ovarian failure: Secondary | ICD-10-CM | POA: Diagnosis not present

## 2021-12-16 DIAGNOSIS — Q5128 Other doubling of uterus, other specified: Secondary | ICD-10-CM | POA: Diagnosis not present

## 2021-12-18 ENCOUNTER — Other Ambulatory Visit: Payer: Self-pay

## 2021-12-23 DIAGNOSIS — R051 Acute cough: Secondary | ICD-10-CM | POA: Diagnosis not present

## 2021-12-23 DIAGNOSIS — J209 Acute bronchitis, unspecified: Secondary | ICD-10-CM | POA: Diagnosis not present

## 2021-12-24 ENCOUNTER — Other Ambulatory Visit: Payer: Self-pay

## 2022-01-01 DIAGNOSIS — Q5128 Other doubling of uterus, other specified: Secondary | ICD-10-CM | POA: Diagnosis not present

## 2022-01-06 DIAGNOSIS — Q51818 Other congenital malformations of uterus: Secondary | ICD-10-CM | POA: Diagnosis not present

## 2022-01-06 DIAGNOSIS — Q5128 Other doubling of uterus, other specified: Secondary | ICD-10-CM | POA: Diagnosis not present

## 2022-01-06 DIAGNOSIS — N898 Other specified noninflammatory disorders of vagina: Secondary | ICD-10-CM | POA: Diagnosis not present

## 2022-01-06 DIAGNOSIS — N979 Female infertility, unspecified: Secondary | ICD-10-CM | POA: Diagnosis not present

## 2022-05-19 ENCOUNTER — Encounter (HOSPITAL_COMMUNITY): Payer: Self-pay | Admitting: Emergency Medicine

## 2022-05-19 ENCOUNTER — Emergency Department (HOSPITAL_COMMUNITY)
Admission: EM | Admit: 2022-05-19 | Discharge: 2022-05-19 | Disposition: A | Discharge status: Home / Self Care | Payer: BC Managed Care – PPO | Attending: Emergency Medicine | Admitting: Emergency Medicine

## 2022-05-19 DIAGNOSIS — N750 Cyst of Bartholin's gland: Secondary | ICD-10-CM | POA: Diagnosis not present

## 2022-05-19 DIAGNOSIS — Z79899 Other long term (current) drug therapy: Secondary | ICD-10-CM | POA: Diagnosis not present

## 2022-05-19 DIAGNOSIS — Z20822 Contact with and (suspected) exposure to covid-19: Secondary | ICD-10-CM | POA: Insufficient documentation

## 2022-05-19 DIAGNOSIS — J069 Acute upper respiratory infection, unspecified: Secondary | ICD-10-CM | POA: Diagnosis not present

## 2022-05-19 DIAGNOSIS — R059 Cough, unspecified: Secondary | ICD-10-CM | POA: Diagnosis present

## 2022-05-19 LAB — PREGNANCY, URINE: Preg Test, Ur: NEGATIVE

## 2022-05-19 LAB — RESP PANEL BY RT-PCR (FLU A&B, COVID) ARPGX2
Influenza A by PCR: NEGATIVE
Influenza B by PCR: NEGATIVE
SARS Coronavirus 2 by RT PCR: NEGATIVE

## 2022-05-19 MED ORDER — BENZONATATE 200 MG PO CAPS: 200.0000 mg | ORAL_CAPSULE | Freq: Three times a day (TID) | ORAL | 0 refills | Status: AC

## 2022-05-19 MED ORDER — ALBUTEROL SULFATE HFA 108 (90 BASE) MCG/ACT IN AERS
2.0000 | INHALATION_SPRAY | RESPIRATORY_TRACT | Status: DC | PRN
  Filled 2022-05-19: qty 6.7

## 2022-05-19 NOTE — Discharge Instructions (Addendum)
Tessalon as needed as prescribed for cough. Follow up in your mychart account for your COVID/flu test results.  Return to work note provided assuming COVID test is negative.  If this is positive, discussed results with your employer.  You can make an appointment to see a GYN provider:   [facility] at [place]  [phone]   [facility] [place] [facility]  [place]  [phone]   [facility] [place] at [place]  [phone]   [facility] [place] [facility]  [place]  ([place] [facility] [employer] at [place]  [phone]   If you already have an established OB/GYN provider in the area you can make an appointment with them as well.

## 2022-05-19 NOTE — ED Triage Notes (Signed)
Pt reports wheezing, cough, body aches, fever. Also worried about a vaginal cyst. Pelvic pain while sitting and shooting pain.

## 2022-05-19 NOTE — ED Provider Notes (Signed)
[facility] EMERGENCY DEPARTMENT Provider Note   CSN: [id number] Arrival date & time: 05/19/22  6468     History  Chief Complaint  Patient presents with   Pelvic Pain   Cough    Jessica Stanton is a 46 y.o. female.  46 year old female with primary complaint of vaginal cyst, onset 3 months ago, progressively becoming bigger and now painful. Does have GYN who is working her up for menopause but hasn't had this evaluated by her GYN. Also with cough, congestion, subjective fever (max 99), body aches. Had bronchitis a few months ago, given Doxycycline by UC which didn't help and was treated with additional antibiotics, steroids, and an inhaler. Cough is productive, clear.   History of asthma as a child, spina bifida, migraines, scoliosis. Former smoker, quit 20 years ago.        Home Medications Prior to Admission medications   Medication Sig Start Date End Date Taking? Authorizing Provider  benzonatate (TESSALON) 200 MG capsule Take 1 capsule (200 mg total) by mouth every 8 (eight) hours for 10 days. 05/19/22 05/29/22 Yes Tacy Learn, PA-C  acetaminophen (TYLENOL) 500 MG tablet Take 1,000 mg by mouth every 6 (six) hours as needed (pain).    [provider]  doxycycline (VIBRA-TABS) 100 MG tablet Take 100 mg by mouth 2 (two) times daily. 09/18/20   [provider]  Menthol, Topical Analgesic, (BIOFREEZE EX) Apply 1 application topically 3 (three) times daily.    [provider]  OVER THE COUNTER MEDICATION Place 1 drop into both eyes daily as needed (allergies/itching). Over the counter allergy eye drops    [provider]  OVER THE COUNTER MEDICATION Take 2 tablets by mouth daily as needed (pain). Curamin - for pain    [provider]  predniSONE (DELTASONE) 20 MG tablet Take 40 mg by mouth every evening. 09/18/20   [provider]  spironolactone (ALDACTONE) 50 MG tablet Take 50 mg by mouth daily.    [provider]  spironolactone (ALDACTONE) 50 MG tablet 1 tablet Orally Once a day 30 days 05/15/21     tiZANidine (ZANAFLEX) 2 MG tablet Take 2 mg by mouth daily. 09/18/20   [provider]  tretinoin (RETIN-A) 0.025 % cream as directed Externally Pea size amount to whole at night 30 days 05/15/21         Allergies    Morphine and related, Penicillin g, and Shellfish allergy    Review of Systems   Review of Systems Negative except as per HPI Physical Exam Updated Vital Signs BP (!) 132/92 (BP Location: Right Arm)   Pulse 79   Temp 98.9 F (37.2 C) (Oral)   Resp 17   Ht '5\' 2"'$  (1.575 m)   Wt 68 kg   LMP 04/18/2022   SpO2 98%   BMI 27.44 kg/m  Physical Exam Vitals and nursing note reviewed. Exam conducted with a chaperone present.  Constitutional:      General: She is not in acute distress.    Appearance: She is well-developed. She is not diaphoretic.  HENT:     Head: Normocephalic and atraumatic.  Cardiovascular:     Rate and Rhythm: Normal rate and regular rhythm.     Heart sounds: Normal heart sounds.  Pulmonary:     Effort: Pulmonary effort is normal.     Breath sounds: Normal breath sounds.  Genitourinary:   Skin:    General: Skin is warm and dry.  Findings: No erythema or rash.  Neurological:     Mental Status: She is alert and oriented to person, place, and time.  Psychiatric:        Behavior: Behavior normal.     ED Results / Procedures / Treatments   Labs (all labs ordered are listed, but only abnormal results are displayed) Labs Reviewed  RESP PANEL BY RT-PCR (FLU A&B, COVID) ARPGX2  PREGNANCY, URINE  POC URINE PREG, ED    EKG None  Radiology No results found.  Procedures Procedures    Medications Ordered in ED Medications  albuterol (VENTOLIN HFA) 108 (90 Base) MCG/ACT inhaler 2 puff (has no administration in time range)    ED Course/ Medical Decision Making/ A&P                           Medical Decision  Making  46 year old female presents with complaint of a tender cyst noted in her vaginal area.  On exam, is found to have likely small Bartholin's gland cyst, does not appear infected at this time, not requiring I&D.  Encourage patient to follow-up with GYN, given home care instructions. Regarding her URI symptoms x4 days, she is well-appearing, nontoxic, afebrile with O2 sat 98% on room air.  Her lungs are clear to auscultation.  Will obtain COVID/flu test prior to discharge, can follow-up in her MyChart account for her results. Pending pregnancy test, plan to discharge with Tessalon for symptom relief.  Suspect viral URI, does not require antibiotics. Urine HCG negative, confirmed in mini lab.         Final Clinical Impression(s) / ED Diagnoses Final diagnoses:  Upper respiratory tract infection, unspecified type  Bartholin cyst    Rx / DC Orders ED Discharge Orders          Ordered    benzonatate (TESSALON) 200 MG capsule  Every 8 hours        05/19/22 1101              Tacy Learn, PA-C 05/19/22 1135    Pattricia Boss, MD 05/20/22 [phone]

## 2022-05-21 ENCOUNTER — Encounter: Payer: BC Managed Care – PPO | Admitting: Radiology

## 2022-07-21 ENCOUNTER — Telehealth: Payer: Self-pay | Admitting: Emergency Medicine

## 2022-07-21 NOTE — Telephone Encounter (Signed)
RC to patient, LVM.

## 2022-07-22 ENCOUNTER — Encounter: Payer: Medicaid Other | Admitting: Obstetrics and Gynecology

## 2022-09-03 IMAGING — MR MR LUMBAR SPINE W/O CM
4 of 5 series · 27 of 48 positions shown · non-contrast
Comparison: None.

CLINICAL DATA: Acute low back pain, leg pain and numbness greater
on right

EXAM:
MRI LUMBAR SPINE WITHOUT CONTRAST
TECHNIQUE: Multiplanar, multisequence MR imaging of the lumbar spine was
performed. No intravenous contrast was administered.

[Series 5: T2 · sagittal · 4.0mm · 0.73mm/px · 6 of 16 slices shown (1 of 2)]
[im 1/16]
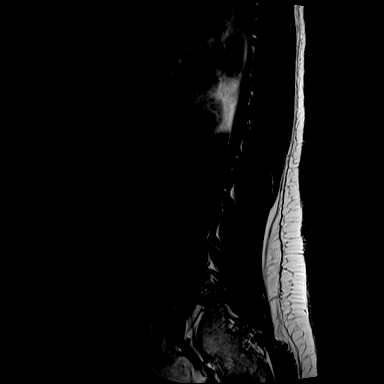
[im 4/16]
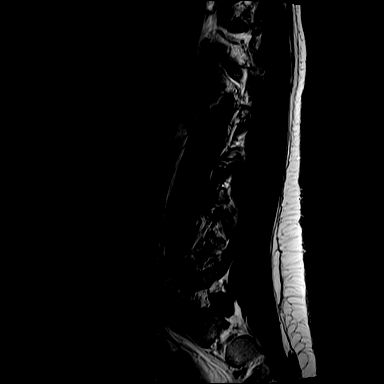
[im 7/16]
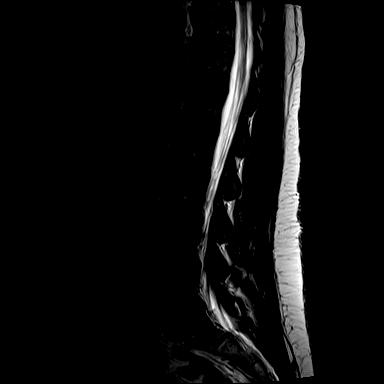
[im 10/16]
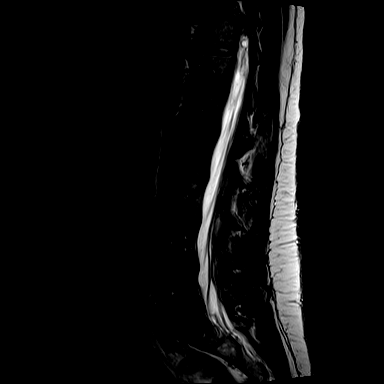
[im 13/16]
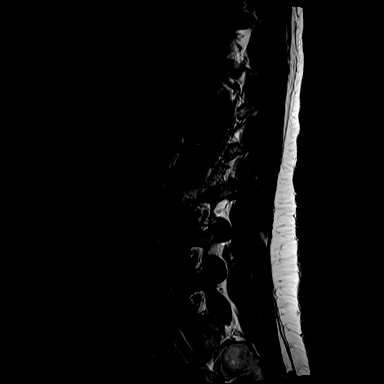
[im 16/16]
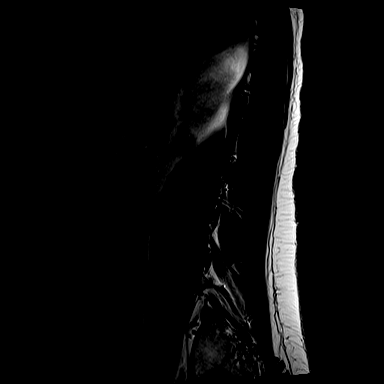

[Series 7: T1 · sagittal · 4.0mm · 0.88mm/px · 7 of 16 slices shown (1 of 2)]
[im 1/16]
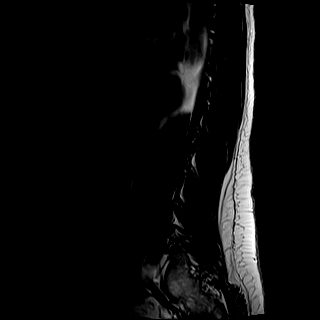
[im 3/16]
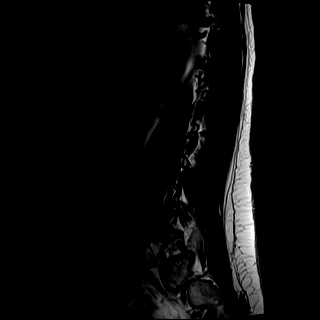
[im 6/16]
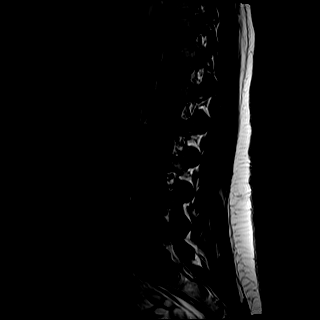
[im 8/16]
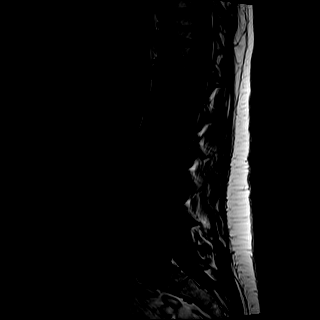
[im 11/16]
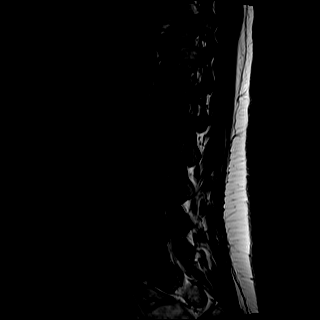
[im 13/16]
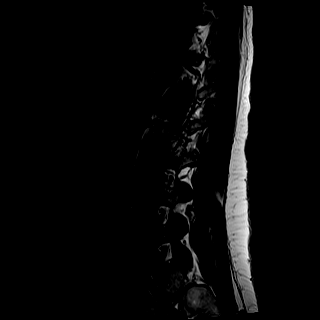
[im 16/16]
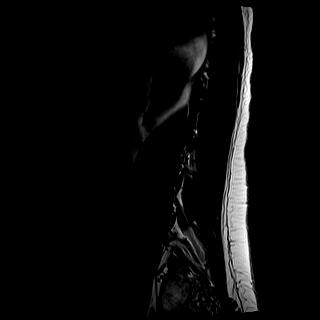

[Series 8: T2 · axial · 4.0mm · 0.57mm/px · z∈[-37,+172]mm · 8 of 32 slices shown (2 of 2)]
[im 1/32]
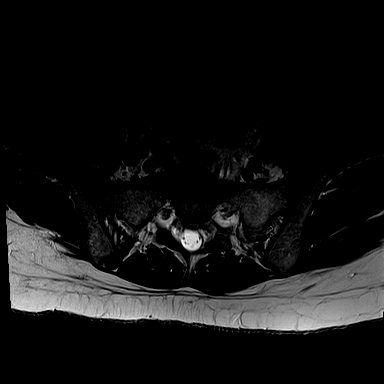
[im 5/32]
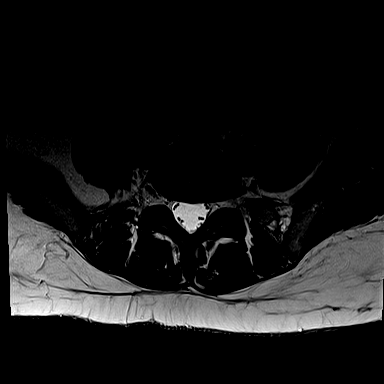
[im 10/32]
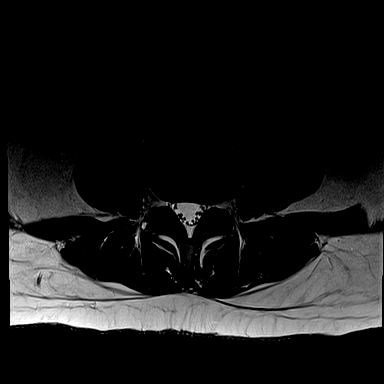
[im 15/32]
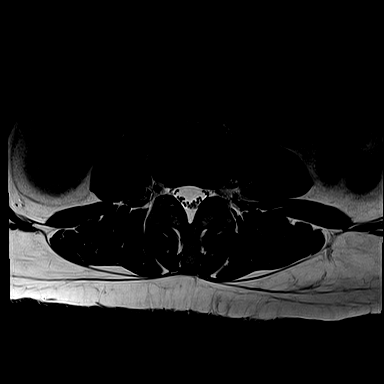
[im 17/32]
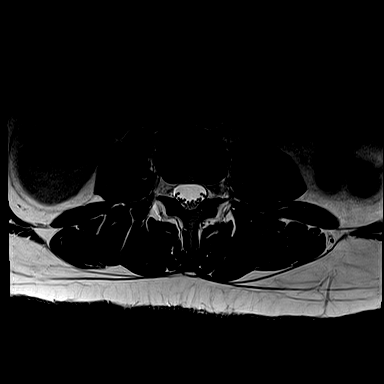
[im 22/32]
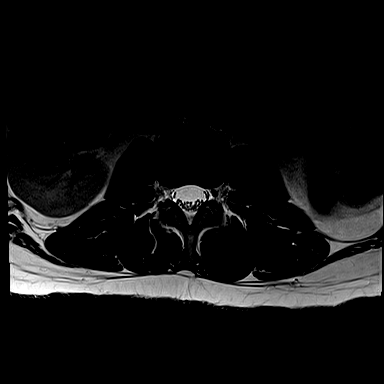
[im 27/32]
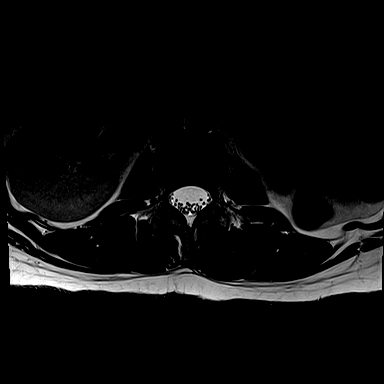
[im 32/32]
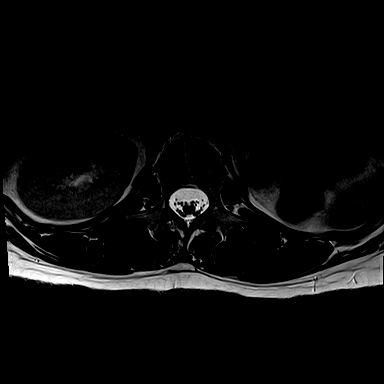

[Series 9: T1 · axial · 4.0mm · 0.34mm/px · z∈[-37,+148]mm · 6 of 32 slices shown (2 of 2)]
[im 1/32]
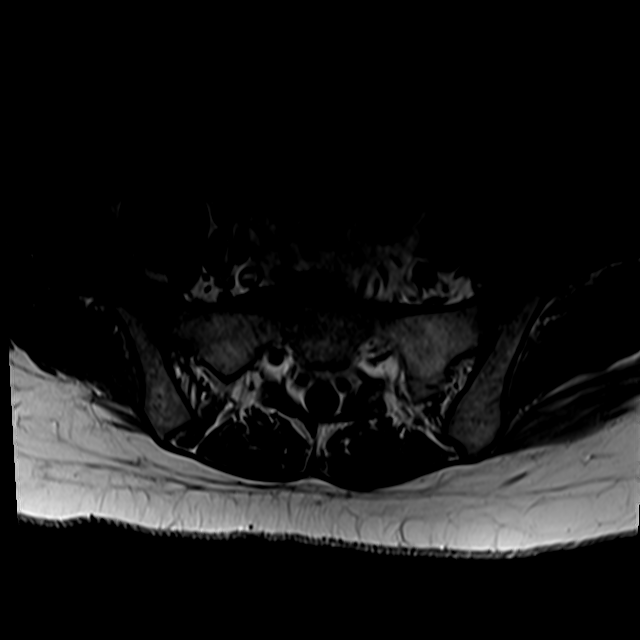
[im 5/32]
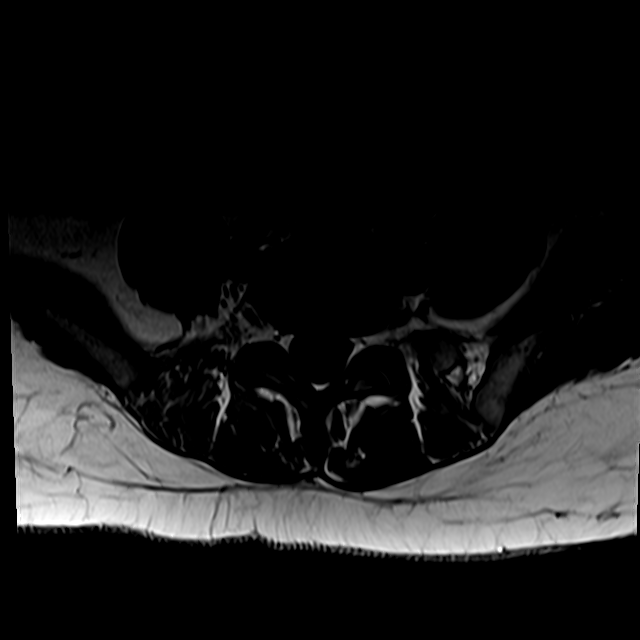
[im 10/32]
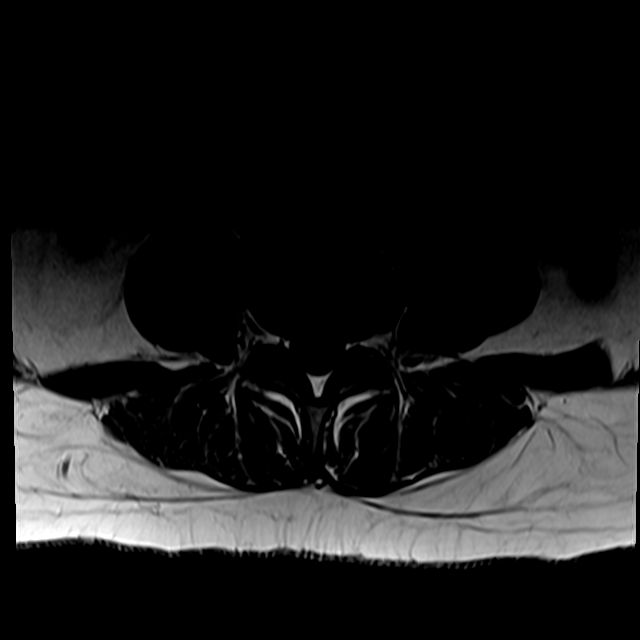
[im 15/32]
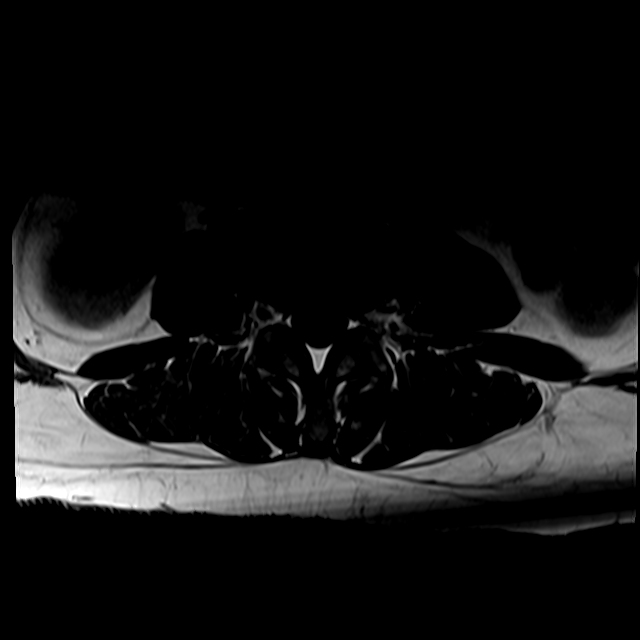
[im 17/32]
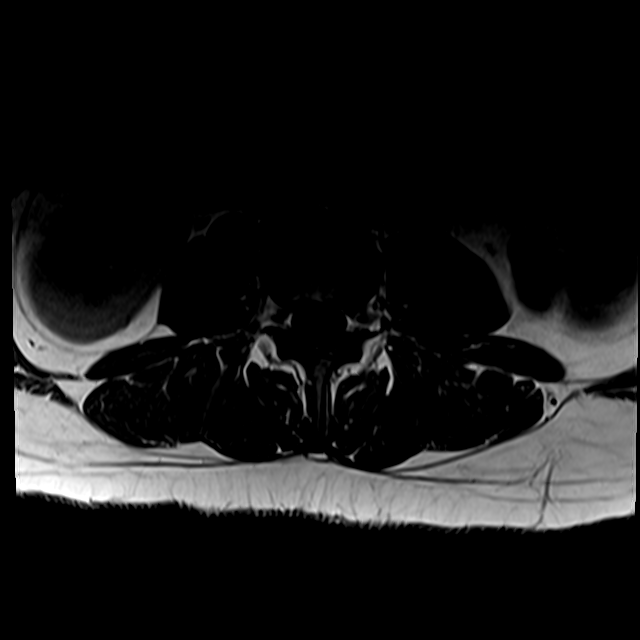
[im 27/32]
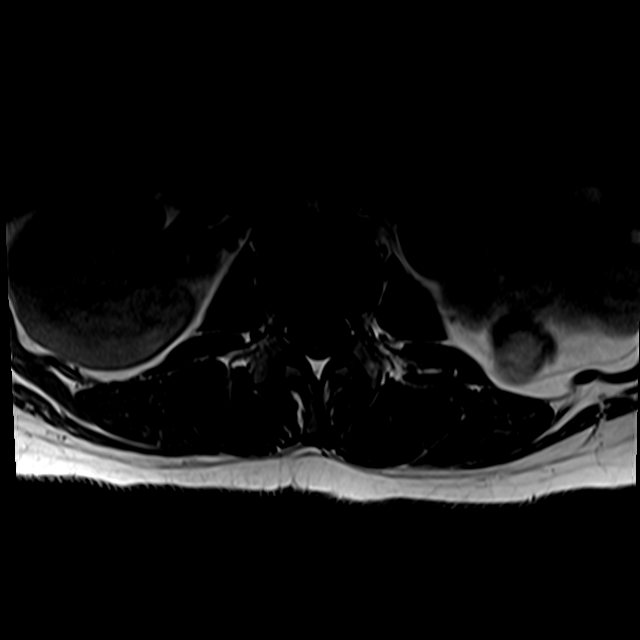

[27 of 48 positions shown; findings below may reference images not displayed]

FINDINGS: Segmentation:  Standard.

Alignment:  Preserved.

Vertebrae: Vertebral body heights are maintained. No marrow edema.
No suspicious osseous lesion.

Conus medullaris and cauda equina: Conus extends to the L1-L2 level.
Conus and cauda equina appear normal.

Paraspinal and other soft tissues: Left kidney is not visualized.

Disc levels: Intervertebral disc heights and signal are maintained.
There is no disc herniation or canal or foraminal stenosis at any
level.
IMPRESSION: No compression deformity or significant degenerative changes.

## 2022-09-16 ENCOUNTER — Telehealth: Payer: Self-pay

## 2022-09-16 NOTE — Telephone Encounter (Signed)
Mychart msg sent. AS, CMA 

## 2023-05-26 ENCOUNTER — Emergency Department (HOSPITAL_COMMUNITY)
Admission: EM | Admit: 2023-05-26 | Discharge: 2023-05-26 | Discharge status: Against Medical Advice | Payer: Medicaid Other | Attending: Emergency Medicine | Admitting: Emergency Medicine

## 2023-05-26 ENCOUNTER — Encounter (HOSPITAL_COMMUNITY): Payer: Self-pay

## 2023-05-26 DIAGNOSIS — G43909 Migraine, unspecified, not intractable, without status migrainosus: Secondary | ICD-10-CM | POA: Diagnosis not present

## 2023-05-26 DIAGNOSIS — Z5329 Procedure and treatment not carried out because of patient's decision for other reasons: Secondary | ICD-10-CM | POA: Insufficient documentation

## 2023-05-26 DIAGNOSIS — R519 Headache, unspecified: Secondary | ICD-10-CM | POA: Insufficient documentation

## 2023-05-26 DIAGNOSIS — R0682 Tachypnea, not elsewhere classified: Secondary | ICD-10-CM | POA: Diagnosis not present

## 2023-05-26 DIAGNOSIS — R457 State of emotional shock and stress, unspecified: Secondary | ICD-10-CM | POA: Diagnosis not present

## 2023-05-26 DIAGNOSIS — R110 Nausea: Secondary | ICD-10-CM | POA: Diagnosis not present

## 2023-05-26 LAB — PREGNANCY, URINE: Preg Test, Ur: NEGATIVE

## 2023-05-26 MED ORDER — LACTATED RINGERS IV BOLUS
1000.0000 mL | Freq: Once | INTRAVENOUS | Status: AC
  Administered 2023-05-26: 1000 mL via INTRAVENOUS

## 2023-05-26 MED ORDER — METOCLOPRAMIDE HCL 5 MG/ML IJ SOLN
10.0000 mg | Freq: Once | INTRAMUSCULAR | Status: AC
Start: 2023-05-26 — End: 2023-05-26
  Administered 2023-05-26: 10 mg via INTRAVENOUS
  Filled 2023-05-26: qty 2

## 2023-05-26 NOTE — ED Provider Notes (Signed)
[facility] AT [facility] Provider Note   CSN: [id number] Arrival date & time: 05/26/23  1227     History  Chief Complaint  Patient presents with   Headache    Jlisa Brys is a 47 y.o. female.   Headache 47 year old female history of depression, migraines presenting for headache.  Patient states she was in class today when she suddenly developed headache.  Is left-sided, behind her left eye.  It was sudden onset and severe.  Worse and different than prior headaches.  She had some nausea, and drove home.  Way home she became very anxious and folic her headache was worse so she called EMS.  Initially improved.  At the time initially started she had some mild numbness in her hands and feet bilaterally.  She feels like her vision is blurry bilaterally as well.  No focal weakness or numbness.  She is concerned.  She feels like her headache is started to come back again.  No fevers or chills or neck stiffness.  She has not been sick.  No recent head trauma.     Home Medications Prior to Admission medications   Medication Sig Start Date End Date Taking? Authorizing Provider  acetaminophen (TYLENOL) 500 MG tablet Take 1,000 mg by mouth every 6 (six) hours as needed (pain).    [provider]  doxycycline (VIBRA-TABS) 100 MG tablet Take 100 mg by mouth 2 (two) times daily. 09/18/20   [provider]  Menthol, Topical Analgesic, (BIOFREEZE EX) Apply 1 application topically 3 (three) times daily.    [provider]  OVER THE COUNTER MEDICATION Place 1 drop into both eyes daily as needed (allergies/itching). Over the counter allergy eye drops    [provider]  OVER THE COUNTER MEDICATION Take 2 tablets by mouth daily as needed (pain). Curamin - for pain    [provider]  predniSONE (DELTASONE) 20 MG tablet Take 40 mg by mouth every evening. 09/18/20   [provider]  spironolactone (ALDACTONE) 50 MG tablet  Take 50 mg by mouth daily.    [provider]  spironolactone (ALDACTONE) 50 MG tablet 1 tablet Orally Once a day 30 days 05/15/21     tiZANidine (ZANAFLEX) 2 MG tablet Take 2 mg by mouth daily. 09/18/20   [provider]  tretinoin (RETIN-A) 0.025 % cream as directed Externally Pea size amount to whole at night 30 days 05/15/21         Allergies    Morphine and codeine, Penicillin g, and Shellfish allergy    Review of Systems   Review of Systems  Neurological:  Positive for headaches.  Review of systems completed and notable as per HPI.  ROS otherwise negative.   Physical Exam Updated Vital Signs BP 115/79 (BP Location: Right Arm)   Pulse 70   Temp 98 F (36.7 C) (Oral)   Resp 18   SpO2 100%  Physical Exam Vitals and nursing note reviewed.  Constitutional:      General: She is not in acute distress.    Appearance: She is well-developed. She is not ill-appearing.  HENT:     Head: Normocephalic and atraumatic.  Eyes:     General: No visual field deficit.    Extraocular Movements: Extraocular movements intact.     Conjunctiva/sclera: Conjunctivae normal.     Pupils: Pupils are equal, round, and reactive to light.  Neck:     Meningeal: Brudzinski's sign and Kernig's sign absent.  Cardiovascular:  Rate and Rhythm: Normal rate and regular rhythm.     Heart sounds: No murmur heard. Pulmonary:     Effort: Pulmonary effort is normal. No respiratory distress.     Breath sounds: Normal breath sounds.  Abdominal:     Palpations: Abdomen is soft.     Tenderness: There is no abdominal tenderness.  Musculoskeletal:        General: No swelling.     Cervical back: Normal range of motion and neck supple. No rigidity.  Skin:    General: Skin is warm and dry.     Capillary Refill: Capillary refill takes less than 2 seconds.  Neurological:     Mental Status: She is alert and oriented to person, place, and time.     Cranial Nerves: No cranial nerve deficit, dysarthria  or facial asymmetry.     Sensory: No sensory deficit.     Motor: No weakness.     Coordination: Coordination normal.     Deep Tendon Reflexes: Reflexes normal.  Psychiatric:        Mood and Affect: Mood normal.     ED Results / Procedures / Treatments   Labs (all labs ordered are listed, but only abnormal results are displayed) Labs Reviewed  PREGNANCY, URINE  BASIC METABOLIC PANEL  CBC WITH DIFFERENTIAL/PLATELET  HCG, SERUM, QUALITATIVE    EKG None  Radiology No results found.  Procedures Procedures    Medications Ordered in ED Medications  lactated ringers bolus 1,000 mL (0 mLs Intravenous Stopped 05/26/23 1400)  metoCLOPramide (REGLAN) injection 10 mg (10 mg Intravenous Given 05/26/23 1340)    ED Course/ Medical Decision Making/ A&P                                 Medical Decision Making Amount and/or Complexity of Data Reviewed Labs: ordered.  Risk Prescription drug management.   Medical Decision Making:   Sarajean Aceituno is a 47 y.o. female who presented to the ED today with headache.  Vital signs reviewed.  Exam shows normal neurologic exam.  She did have some numbness in her hands and feet, although this was also in the context of possible anxiety attack.  Headache is new and different and was sudden onset, will obtain CTA head and neck to evaluate for intracranial hemorrhage, aneurysm, subarachnoid hemorrhage.  She is no signs of CNS infection.  Also consider atypical migraine.    Reviewed and confirmed nursing documentation for past medical history, family history, social history.  Reassessment and Plan:   Patient eloped prior to completing evaluation.  Labs and imaging were still pending.   Patient's presentation is most consistent with acute presentation with potential threat to life or bodily function.           Final Clinical Impression(s) / ED Diagnoses Final diagnoses:  Acute nonintractable headache, unspecified headache type     Rx / DC Orders ED Discharge Orders     None         Laurence Spates, MD 05/26/23 [id number]

## 2023-05-26 NOTE — ED Notes (Signed)
X1 blood draw attempt.

## 2023-05-26 NOTE — ED Triage Notes (Signed)
[facility] reports [place], pt was having a anxiety attack and c/o headache. EMS states pt was hyperventilating on arrival, able to calm pt down.

## 2023-05-26 NOTE — ED Notes (Signed)
Patient wanting to leave AMA. IV removed. Refused to sign AMA paperwork.

## 2023-05-31 DIAGNOSIS — B86 Scabies: Secondary | ICD-10-CM | POA: Diagnosis not present

## 2023-05-31 DIAGNOSIS — R2689 Other abnormalities of gait and mobility: Secondary | ICD-10-CM | POA: Diagnosis not present

## 2023-05-31 DIAGNOSIS — Z113 Encounter for screening for infections with a predominantly sexual mode of transmission: Secondary | ICD-10-CM | POA: Diagnosis not present

## 2023-05-31 DIAGNOSIS — R21 Rash and other nonspecific skin eruption: Secondary | ICD-10-CM | POA: Diagnosis not present

## 2023-05-31 DIAGNOSIS — G8929 Other chronic pain: Secondary | ICD-10-CM | POA: Diagnosis not present

## 2023-05-31 DIAGNOSIS — R519 Headache, unspecified: Secondary | ICD-10-CM | POA: Diagnosis not present

## 2023-05-31 DIAGNOSIS — M791 Myalgia, unspecified site: Secondary | ICD-10-CM | POA: Diagnosis not present

## 2023-06-13 DIAGNOSIS — R5383 Other fatigue: Secondary | ICD-10-CM | POA: Diagnosis not present

## 2023-06-13 DIAGNOSIS — R2689 Other abnormalities of gait and mobility: Secondary | ICD-10-CM | POA: Diagnosis not present

## 2023-06-13 DIAGNOSIS — R634 Abnormal weight loss: Secondary | ICD-10-CM | POA: Diagnosis not present

## 2023-06-13 DIAGNOSIS — G8929 Other chronic pain: Secondary | ICD-10-CM | POA: Diagnosis not present

## 2023-06-13 DIAGNOSIS — R232 Flushing: Secondary | ICD-10-CM | POA: Diagnosis not present

## 2023-06-13 DIAGNOSIS — N951 Menopausal and female climacteric states: Secondary | ICD-10-CM | POA: Diagnosis not present

## 2023-06-13 DIAGNOSIS — R519 Headache, unspecified: Secondary | ICD-10-CM | POA: Diagnosis not present

## 2023-06-21 DIAGNOSIS — R930 Abnormal findings on diagnostic imaging of skull and head, not elsewhere classified: Secondary | ICD-10-CM | POA: Diagnosis not present

## 2023-06-21 DIAGNOSIS — R269 Unspecified abnormalities of gait and mobility: Secondary | ICD-10-CM | POA: Diagnosis not present

## 2023-06-21 DIAGNOSIS — R519 Headache, unspecified: Secondary | ICD-10-CM | POA: Diagnosis not present

## 2023-06-27 DIAGNOSIS — N898 Other specified noninflammatory disorders of vagina: Secondary | ICD-10-CM | POA: Diagnosis not present

## 2023-06-27 DIAGNOSIS — Z0000 Encounter for general adult medical examination without abnormal findings: Secondary | ICD-10-CM | POA: Diagnosis not present

## 2023-06-27 DIAGNOSIS — R92323 Mammographic fibroglandular density, bilateral breasts: Secondary | ICD-10-CM | POA: Diagnosis not present

## 2023-06-27 DIAGNOSIS — Z124 Encounter for screening for malignant neoplasm of cervix: Secondary | ICD-10-CM | POA: Diagnosis not present

## 2023-06-27 DIAGNOSIS — Z1231 Encounter for screening mammogram for malignant neoplasm of breast: Secondary | ICD-10-CM | POA: Diagnosis not present

## 2023-06-30 DIAGNOSIS — R930 Abnormal findings on diagnostic imaging of skull and head, not elsewhere classified: Secondary | ICD-10-CM | POA: Diagnosis not present

## 2023-07-13 DIAGNOSIS — R519 Headache, unspecified: Secondary | ICD-10-CM | POA: Diagnosis not present

## 2023-09-12 DIAGNOSIS — H5213 Myopia, bilateral: Secondary | ICD-10-CM | POA: Diagnosis not present

## 2023-10-10 DIAGNOSIS — N907 Vulvar cyst: Secondary | ICD-10-CM | POA: Diagnosis not present

## 2023-10-10 DIAGNOSIS — R102 Pelvic and perineal pain: Secondary | ICD-10-CM | POA: Diagnosis not present

## 2023-10-10 DIAGNOSIS — N939 Abnormal uterine and vaginal bleeding, unspecified: Secondary | ICD-10-CM | POA: Diagnosis not present

## 2023-12-07 DIAGNOSIS — R102 Pelvic and perineal pain: Secondary | ICD-10-CM | POA: Diagnosis not present

## 2023-12-15 DIAGNOSIS — R002 Palpitations: Secondary | ICD-10-CM | POA: Diagnosis not present

## 2023-12-15 DIAGNOSIS — K5900 Constipation, unspecified: Secondary | ICD-10-CM | POA: Diagnosis not present

## 2023-12-28 DIAGNOSIS — R102 Pelvic and perineal pain: Secondary | ICD-10-CM | POA: Diagnosis not present

## 2023-12-28 DIAGNOSIS — K5900 Constipation, unspecified: Secondary | ICD-10-CM | POA: Diagnosis not present

## 2023-12-28 DIAGNOSIS — N939 Abnormal uterine and vaginal bleeding, unspecified: Secondary | ICD-10-CM | POA: Diagnosis not present

## 2023-12-28 DIAGNOSIS — Q5128 Other doubling of uterus, other specified: Secondary | ICD-10-CM | POA: Diagnosis not present

## 2023-12-28 DIAGNOSIS — N950 Postmenopausal bleeding: Secondary | ICD-10-CM | POA: Diagnosis not present

## 2024-01-06 DIAGNOSIS — R0600 Dyspnea, unspecified: Secondary | ICD-10-CM | POA: Diagnosis not present

## 2024-01-06 DIAGNOSIS — R002 Palpitations: Secondary | ICD-10-CM | POA: Diagnosis not present

## 2024-01-06 DIAGNOSIS — E663 Overweight: Secondary | ICD-10-CM | POA: Diagnosis not present

## 2024-01-06 DIAGNOSIS — E785 Hyperlipidemia, unspecified: Secondary | ICD-10-CM | POA: Diagnosis not present

## 2024-01-11 DIAGNOSIS — R0600 Dyspnea, unspecified: Secondary | ICD-10-CM | POA: Diagnosis not present

## 2024-01-11 DIAGNOSIS — E663 Overweight: Secondary | ICD-10-CM | POA: Diagnosis not present

## 2024-01-11 DIAGNOSIS — R002 Palpitations: Secondary | ICD-10-CM | POA: Diagnosis not present

## 2024-01-11 DIAGNOSIS — E785 Hyperlipidemia, unspecified: Secondary | ICD-10-CM | POA: Diagnosis not present

## 2024-01-23 DIAGNOSIS — R1013 Epigastric pain: Secondary | ICD-10-CM | POA: Diagnosis not present

## 2024-01-23 DIAGNOSIS — R1084 Generalized abdominal pain: Secondary | ICD-10-CM | POA: Diagnosis not present

## 2024-01-23 DIAGNOSIS — K5900 Constipation, unspecified: Secondary | ICD-10-CM | POA: Diagnosis not present

## 2024-01-27 DIAGNOSIS — R0600 Dyspnea, unspecified: Secondary | ICD-10-CM | POA: Diagnosis not present

## 2024-01-30 DIAGNOSIS — R002 Palpitations: Secondary | ICD-10-CM | POA: Diagnosis not present

## 2024-01-30 DIAGNOSIS — R0600 Dyspnea, unspecified: Secondary | ICD-10-CM | POA: Diagnosis not present

## 2024-01-30 DIAGNOSIS — E785 Hyperlipidemia, unspecified: Secondary | ICD-10-CM | POA: Diagnosis not present

## 2024-01-30 DIAGNOSIS — E663 Overweight: Secondary | ICD-10-CM | POA: Diagnosis not present

## 2024-03-19 DIAGNOSIS — R110 Nausea: Secondary | ICD-10-CM | POA: Diagnosis not present

## 2024-03-19 DIAGNOSIS — R6883 Chills (without fever): Secondary | ICD-10-CM | POA: Diagnosis not present

## 2024-03-19 DIAGNOSIS — L03211 Cellulitis of face: Secondary | ICD-10-CM | POA: Diagnosis not present

## 2024-03-19 DIAGNOSIS — R100 Acute abdomen: Secondary | ICD-10-CM | POA: Diagnosis not present

## 2024-03-22 DIAGNOSIS — L309 Dermatitis, unspecified: Secondary | ICD-10-CM | POA: Diagnosis not present

## 2024-03-22 DIAGNOSIS — B86 Scabies: Secondary | ICD-10-CM | POA: Diagnosis not present

## 2024-03-22 DIAGNOSIS — L299 Pruritus, unspecified: Secondary | ICD-10-CM | POA: Diagnosis not present

## 2024-05-23 DIAGNOSIS — R09A2 Foreign body sensation, throat: Secondary | ICD-10-CM | POA: Diagnosis not present

## 2024-05-23 DIAGNOSIS — R1310 Dysphagia, unspecified: Secondary | ICD-10-CM | POA: Diagnosis not present

## 2024-05-23 DIAGNOSIS — R109 Unspecified abdominal pain: Secondary | ICD-10-CM | POA: Diagnosis not present

## 2024-05-23 DIAGNOSIS — R197 Diarrhea, unspecified: Secondary | ICD-10-CM | POA: Diagnosis not present

## 2024-05-23 DIAGNOSIS — K5900 Constipation, unspecified: Secondary | ICD-10-CM | POA: Diagnosis not present

## 2024-05-23 DIAGNOSIS — R1013 Epigastric pain: Secondary | ICD-10-CM | POA: Diagnosis not present

## 2024-05-23 DIAGNOSIS — K921 Melena: Secondary | ICD-10-CM | POA: Diagnosis not present

## 2024-06-01 DIAGNOSIS — R09A2 Foreign body sensation, throat: Secondary | ICD-10-CM | POA: Diagnosis not present

## 2024-06-01 DIAGNOSIS — R1310 Dysphagia, unspecified: Secondary | ICD-10-CM | POA: Diagnosis not present

## 2024-06-01 DIAGNOSIS — K5900 Constipation, unspecified: Secondary | ICD-10-CM | POA: Diagnosis not present

## 2024-06-01 DIAGNOSIS — R197 Diarrhea, unspecified: Secondary | ICD-10-CM | POA: Diagnosis not present

## 2024-06-01 DIAGNOSIS — R109 Unspecified abdominal pain: Secondary | ICD-10-CM | POA: Diagnosis not present

## 2024-06-01 DIAGNOSIS — K921 Melena: Secondary | ICD-10-CM | POA: Diagnosis not present

## 2024-10-09 ENCOUNTER — Ambulatory Visit: Admitting: Internal Medicine

## 2024-10-10 ENCOUNTER — Other Ambulatory Visit (HOSPITAL_COMMUNITY)
Admission: RE | Admit: 2024-10-10 | Discharge: 2024-10-10 | Disposition: A | Discharge status: Home / Self Care | Source: Ambulatory Visit | Attending: Family | Admitting: Family

## 2024-10-10 ENCOUNTER — Encounter: Admitting: Family

## 2024-10-10 ENCOUNTER — Encounter: Payer: Self-pay | Admitting: Family

## 2024-10-10 ENCOUNTER — Ambulatory Visit: Admitting: Family

## 2024-10-10 ENCOUNTER — Ambulatory Visit: Payer: Self-pay | Admitting: Family

## 2024-10-10 VITALS — BP 119/61 | HR 70 | Ht 62.0 in | Wt 164.0 lb

## 2024-10-10 DIAGNOSIS — Z113 Encounter for screening for infections with a predominantly sexual mode of transmission: Secondary | ICD-10-CM | POA: Insufficient documentation

## 2024-10-10 DIAGNOSIS — Z124 Encounter for screening for malignant neoplasm of cervix: Secondary | ICD-10-CM

## 2024-10-10 DIAGNOSIS — F32A Depression, unspecified: Secondary | ICD-10-CM

## 2024-10-10 DIAGNOSIS — F419 Anxiety disorder, unspecified: Secondary | ICD-10-CM | POA: Diagnosis not present

## 2024-10-10 DIAGNOSIS — Z114 Encounter for screening for human immunodeficiency virus [HIV]: Secondary | ICD-10-CM

## 2024-10-10 DIAGNOSIS — Z3202 Encounter for pregnancy test, result negative: Secondary | ICD-10-CM

## 2024-10-10 DIAGNOSIS — Z7689 Persons encountering health services in other specified circumstances: Secondary | ICD-10-CM

## 2024-10-10 DIAGNOSIS — G43909 Migraine, unspecified, not intractable, without status migrainosus: Secondary | ICD-10-CM | POA: Diagnosis not present

## 2024-10-10 LAB — POCT URINE PREGNANCY: Preg Test, Ur: NEGATIVE

## 2024-10-10 LAB — POCT URINALYSIS DIP (CLINITEK)
Bilirubin, UA: NEGATIVE
Glucose, UA: NEGATIVE mg/dL
Ketones, POC UA: NEGATIVE mg/dL
Nitrite, UA: NEGATIVE
POC PROTEIN,UA: NEGATIVE
Spec Grav, UA: 1.015
Urobilinogen, UA: 0.2 U/dL
pH, UA: 7

## 2024-10-10 MED ORDER — ESCITALOPRAM OXALATE 5 MG PO TABS: 5.0000 mg | ORAL_TABLET | Freq: Every day | ORAL | 0 refills | Status: AC

## 2024-10-10 MED ORDER — SUMATRIPTAN SUCCINATE 25 MG PO TABS: ORAL_TABLET | ORAL | 1 refills | Status: AC

## 2024-10-10 MED ORDER — TOPIRAMATE 25 MG PO TABS: 25.0000 mg | ORAL_TABLET | Freq: Every day | ORAL | 0 refills | Status: AC

## 2024-10-10 NOTE — Progress Notes (Signed)
 Erroneous encounter-disregard

## 2024-10-10 NOTE — Progress Notes (Signed)
 "  Subjective:    Jessica Stanton - 49 y.o. female MRN [id number]  Date of birth: 11/21/1975  HPI  Jessica Stanton is to establish care.   Current issues and/or concerns: - Patient states she was initially referred to Neurology due to abnormal MRI. Patient states she was seen by Neurology and told them that the bones in her head were popping. Patient states Neurology told that nothing was wrong due to updated imaging was normal. Patient states she doesn't feel Neurology listened to her concerns. Patient states the neurologist was rude and did not do proper research for a possible new diagnosis. Patient states on Google there are many people who have similar symptoms to herself. Patient states she is taking over-the-counter medications to help with current migraines (denies red flag symptoms). Patient would like to try prescription for migraines to see if this helps. Patient would like second opinion referral to Neurology.  - Patient states discomfort during intercourse and would like routine screening. Patient states uterine discomfort (denies red flag symptoms). In the past patient was established with Gynecology and would like referral back to the same. Patient states in the past she did not follow-up with Gynecology due to they were rude.  - Anxiety depression. Patient states primarily related to trauma (childhood and adulthood) and doesn't trust anyone. She would like to try medication to see if this helps. She denies thoughts of self-harm, suicidal ideations, homicidal ideations.   ROS per HPI    Health Maintenance:  Health Maintenance Due  Topic Date Due   Hepatitis C Screening  Never done   DTaP/Tdap/Td (3 - Td or Tdap) 09/13/2016   Mammogram  11/05/2017   Cervical Cancer Screening (HPV/Pap Cotest)  01/11/2018   Hepatitis B Vaccines 19-59 Average Risk (3 of 3 - 19+ 3-dose series) 06/08/2021   Colonoscopy  Never done   Influenza Vaccine  Never done   COVID-19 Vaccine (1 - 2025-26  season) Never done     Past Medical History: Patient Active Problem List   Diagnosis Date Noted   Tick bite of abdomen 03/13/2018      Social History   reports that she has quit smoking. She has never used smokeless tobacco. She reports that she does not drink alcohol and does not use drugs.   Family History  family history includes Diabetes in an other family member; Heart disease in an other family member; Thyroid  cancer in her mother. She was adopted.   Medications: reviewed and updated   Objective:   Physical Exam BP 119/61 (BP Location: Right Arm, Patient Position: Sitting, Cuff Size: Large)   Pulse 70   Ht 5' 2 (1.575 m)   Wt 164 lb (74.4 kg)   SpO2 97%   BMI 30.00 kg/m   Physical Exam HENT:     Head: Normocephalic and atraumatic.     Nose: Nose normal.     Mouth/Throat:     Mouth: Mucous membranes are moist.     Pharynx: Oropharynx is clear.  Eyes:     Extraocular Movements: Extraocular movements intact.     Conjunctiva/sclera: Conjunctivae normal.     Pupils: Pupils are equal, round, and reactive to light.  Cardiovascular:     Rate and Rhythm: Normal rate and regular rhythm.     Pulses: Normal pulses.     Heart sounds: Normal heart sounds.  Pulmonary:     Effort: Pulmonary effort is normal.     Breath sounds: Normal breath sounds.  Musculoskeletal:  General: Normal range of motion.     Cervical back: Normal range of motion and neck supple.  Neurological:     General: No focal deficit present.     Mental Status: She is alert and oriented to person, place, and time.  Psychiatric:        Mood and Affect: Affect is tearful.        Behavior: Behavior normal.       Assessment & Plan:  1. Encounter to establish care (Primary) - Patient presents today to establish care. During the interim follow-up with primary provider as scheduled.  - Return for annual physical examination, labs, and health maintenance. Arrive fasting meaning having no food for  at least 8 hours prior to appointment. You may have only water or black coffee. Please take scheduled medications as normal.  2. Migraine without status migrainosus, not intractable, unspecified migraine type - Sumatriptan  and Topiramate  as prescribed. Counseled on medication adherence/adverse effects.  - Referral to Neurology for evaluation/management.  - Follow-up with primary provider as scheduled.  - topiramate  (TOPAMAX ) 25 MG tablet; Take 1 tablet (25 mg total) by mouth daily.  Dispense: 90 tablet; Refill: 0 - SUMAtriptan  (IMITREX ) 25 MG tablet; Take 25 mg (1 tablet total) by mouth at the start of the headache. May repeat in 2 hours x 1 if headache persists. Max of 2 tablets/24 hours.  Dispense: 90 tablet; Refill: 1 - Ambulatory referral to Neurology  3. Anxiety and depression - Patient denies thoughts of self-harm, suicidal ideations, homicidal ideations. - Escitalopram  as prescribed. Counseled on medication adherence/adverse effects.  - Patient declined referral to Psychiatry.  - Follow-up with primary provider in 4 weeks or sooner if needed.  - escitalopram  (LEXAPRO ) 5 MG tablet; Take 1 tablet (5 mg total) by mouth daily.  Dispense: 90 tablet; Refill: 0  4. Cervical cancer screening - Referral to Gynecology for evaluation/management.  - Ambulatory referral to Gynecology  5. Routine screening for STI (sexually transmitted infection) - Routine screening.  - Cervicovaginal ancillary only - RPR - POCT URINALYSIS DIP (CLINITEK); Future - POCT urine pregnancy; Future  6. Encounter for screening for HIV - Routine screening.  - HIV antibody (with reflex)    Patient was given clear instructions to go to Emergency Department or return to medical center if symptoms don't improve, worsen, or new problems develop.The patient verbalized understanding.  I discussed the assessment and treatment plan with the patient. The patient was provided an opportunity to ask questions and all were  answered. The patient agreed with the plan and demonstrated an understanding of the instructions.   The patient was advised to call back or seek an in-person evaluation if the symptoms worsen or if the condition fails to improve as anticipated.    Greig Chute, NP 10/10/2024, 9:54 AM Primary Care at [facility]  "

## 2024-10-11 LAB — CERVICOVAGINAL ANCILLARY ONLY
Bacterial Vaginitis (gardnerella): NEGATIVE
Candida Glabrata: NEGATIVE
Candida Vaginitis: NEGATIVE
Chlamydia: NEGATIVE
Comment: NEGATIVE
Comment: NEGATIVE
Comment: NEGATIVE
Comment: NEGATIVE
Comment: NEGATIVE
Comment: NORMAL
Neisseria Gonorrhea: NEGATIVE
Trichomonas: NEGATIVE

## 2024-10-11 LAB — SYPHILIS: RPR W/REFLEX TO RPR TITER AND TREPONEMAL ANTIBODIES, TRADITIONAL SCREENING AND DIAGNOSIS ALGORITHM: RPR Ser Ql: NONREACTIVE

## 2024-10-11 LAB — HIV ANTIBODY (ROUTINE TESTING W REFLEX): HIV Screen 4th Generation wRfx: NONREACTIVE

## 2025-05-09 ENCOUNTER — Encounter: Payer: Self-pay | Admitting: Family
